# Patient Record
Sex: Male | Born: 1960
Health system: Southern US, Community
[De-identification: ages and names within clinical notes are randomized; demographics above are authoritative.]

## PROBLEM LIST (undated history)

## (undated) DIAGNOSIS — F419 Anxiety disorder, unspecified: Secondary | ICD-10-CM

## (undated) DIAGNOSIS — N400 Enlarged prostate without lower urinary tract symptoms: Secondary | ICD-10-CM

## (undated) DIAGNOSIS — E291 Testicular hypofunction: Secondary | ICD-10-CM

## (undated) DIAGNOSIS — E7800 Pure hypercholesterolemia, unspecified: Secondary | ICD-10-CM

## (undated) HISTORY — DX: Testicular hypofunction: E29.1

---

## 2007-06-02 ENCOUNTER — Other Ambulatory Visit: Payer: Self-pay

## 2007-06-02 ENCOUNTER — Ambulatory Visit: Payer: Self-pay | Admitting: Family Medicine

## 2013-11-01 LAB — BASIC METABOLIC PANEL
Anion Gap: 11 (ref 7–16)
BUN: 16 mg/dL (ref 7–18)
CALCIUM: 8.9 mg/dL (ref 8.5–10.1)
CREATININE: 1.54 mg/dL — AB (ref 0.60–1.30)
Chloride: 106 mmol/L (ref 98–107)
Co2: 24 mmol/L (ref 21–32)
EGFR (Non-African Amer.): 51 — ABNORMAL LOW
GFR CALC AF AMER: 60 — AB
Glucose: 170 mg/dL — ABNORMAL HIGH (ref 65–99)
Osmolality: 286 (ref 275–301)
POTASSIUM: 3.3 mmol/L — AB (ref 3.5–5.1)
SODIUM: 141 mmol/L (ref 136–145)

## 2013-11-01 LAB — TROPONIN I: TROPONIN-I: 0.02 ng/mL

## 2013-11-02 ENCOUNTER — Observation Stay: Payer: Self-pay | Admitting: Internal Medicine

## 2013-11-02 LAB — CBC
HCT: 42.2 % (ref 40.0–52.0)
HGB: 14 g/dL (ref 13.0–18.0)
MCH: 30 pg (ref 26.0–34.0)
MCHC: 33.2 g/dL (ref 32.0–36.0)
MCV: 90 fL (ref 80–100)
Platelet: 255 10*3/uL (ref 150–440)
RBC: 4.67 10*6/uL (ref 4.40–5.90)
RDW: 13.1 % (ref 11.5–14.5)
WBC: 20.2 10*3/uL — AB (ref 3.8–10.6)

## 2013-11-02 LAB — TROPONIN I
TROPONIN-I: 0.07 ng/mL — AB
Troponin-I: 0.09 ng/mL — ABNORMAL HIGH

## 2013-11-02 LAB — CK-MB
CK-MB: 3.9 ng/mL — AB (ref 0.5–3.6)
CK-MB: 5.1 ng/mL — ABNORMAL HIGH (ref 0.5–3.6)
CK-MB: 2.9 ng/mL (ref 0.5–3.6)

## 2013-12-24 ENCOUNTER — Ambulatory Visit: Payer: Self-pay | Admitting: Internal Medicine

## 2014-04-09 ENCOUNTER — Ambulatory Visit: Payer: Self-pay | Admitting: Urology

## 2014-09-20 NOTE — H&P (Signed)
PATIENT NAME:  Charles Turner, Charles Turner MR#:  [id number] DATE OF BIRTH:  1961/02/13  DATE OF ADMISSION:  11/02/2013  REFERRING PHYSICIAN: Lisa Roca, MD  PRIMARY CARE PHYSICIAN: Dr. Ronnald Ramp.  CHIEF COMPLAINT: Allergic reaction.  HISTORY OF PRESENT ILLNESS: A 54 year old Caucasian gentleman with a past medical history of hyperlipidemia, presenting after an allergic reaction, just prior to arrival had a bee sting on both of his legs. He self administered EpiPen after he was having symptoms of shortness of breath, palpitations, and flushing. He denies any chest pains or further symptomatology. After receiving his EpiPen they called 911. When EMS arrived, he was noted to be hypotensive with a systolic blood pressure in the 60s; however, he is normotensive by arrival to the Emergency Department. He currently denies any further symptomatology. No chest pain, palpitations, shortness of breath, chest pain; however, on ER workup noted to have ST depressions as well as T inversions laterally on EKG. No baseline to compare to.  REVIEW OF SYSTEMS:  CONSTITUTIONAL: Denies any fever, fatigue, weakness, pain. EYES: Denies blurry vision, double vision, or eye pain.  HEENT: Denies tinnitus, ear pain, or hearing loss.  RESPIRATORY: Denies current cough, wheeze, or shortness of breath. CARDIOVASCULAR: Denies any chest pain, palpitations, edema.  GASTROINTESTINAL: Denies nausea, vomiting, diarrhea, or abdominal pain.  GENITOURINARY: Denies dysuria or hematuria.  ENDOCRINE: Denies nocturia or thyroid problems.  HEMATOLOGIC AND LYMPHATIC: Denies any bruising or bleeding. SKIN: Positive for urticaria, bilateral lower extremities around bee sting site. No other rashes or lesions. MUSCULOSKELETAL: Denies pain in neck, back, shoulders, knees, hips, or arthritic symptoms.  NEUROLOGIC: Denies paralysis or paresthesias.  PSYCHIATRIC: Denies anxiety or depressive symptoms.   Otherwise, full review of systems performed by me is  negative.   PAST MEDICAL HISTORY: Hyperlipidemia.   SOCIAL HISTORY: Denies any alcohol, tobacco, or drug usage.   FAMILY HISTORY: Denies any known cardiovascular or pulmonary disorders.   ALLERGIES: NO KNOWN DRUG ALLERGIES. POSITIVE FOR BEE STINGS.   HOME MEDICATIONS: Include atorvastatin 20 mg p.o. daily.   PHYSICAL EXAMINATION:  VITAL SIGNS: Temperature 98.4, heart rate 96, respirations 20, blood pressure 139/86, saturating 99% on room air. Weight 97.5 kg, BMI 29.2.  GENERAL: Well-nourished, well-developed, Caucasian gentleman, currently in no acute distress.  HEAD: Normocephalic, atraumatic.  EYES: Pupils equal, round, reactive to light. Extraocular muscles intact. No scleral icterus.  MOUTH: Moist mucous membranes. Dentition intact. No abscess noted.  EARS, NOSE, AND THROAT: Clear without exudates. No external lesions.  NECK: Supple. No thyromegaly. No nodules. No JVD.  PULMONARY: Clear to auscultation bilaterally without wheezes, rales, or rhonchi. No use of accessory muscles. Good respiratory effort.  CHEST:  Nontender on palpation.  CARDIOVASCULAR: S1, S2, regular rate and rhythm. No murmurs, rubs, or gallops. No edema. Pedal pulses 2+ bilaterally.  GASTROINTESTINAL: Soft, nontender, nondistended. No masses. Positive bowel sounds. No hepatosplenomegaly.  MUSCULOSKELETAL: No swelling, clubbing, or edema. Range of motion is full in all extremities. NEUROLOGIC: Cranial nerves II-XII intact. No gross neurologic deficits. Sensation intact. Reflexes intact.  SKIN: Bilateral ankles: There is urticaria with surrounding erythema. No further ulcerations, lesions, no rashes, or cyanosis. Skin warm and dry. Turgor intact.  PSYCHIATRIC: Mood and affect within normal limits. The patient alert and oriented x 3. Insight and judgment intact.   LABORATORY DATA: Chest x-ray performed reveals no acute cardiopulmonary process. EKG performed revealing normal sinus rhythm; however, lateral ST depression  less than 0.5 mm without prior EKGs for comparison.  The remainder of laboratory data: Sodium 141,  potassium 3.3, chloride 106, bicarbonate 24, BUN 16, creatinine 1.54 glucose 170. Troponin 0.02.   ASSESSMENT AND PLAN:  109. A 54 year old gentleman with a history of hyperlipidemia, presenting after an allergic reaction found that EKG abnormalities. On initial workup 1 EKG abnormality. He is admitted to telemetry under observational status. Initiate aspirin and statin therapy. Trend cardiac enzymes x 3.  2. Allergic reaction greatly improved as far as symptoms were concerned. He will need an EpiPen for discharge as his were actually expired in 2013.  3. Hyperlipidemia. Continue statin therapy. 4. Venous thromboembolism prophylaxis with heparin subcutaneous.  CODE STATUS: The patient is a full code.  TIME SPENT: 45 minutes.     ____________________________ Aaron Mose. Hower, MD dkh:lt D: 11/02/2013 00:47:02 ET T: 11/02/2013 02:11:48 ET JOB#: [id number]  cc: Aaron Mose. Hower, MD, <Dictator> DAVID Woodfin Ganja MD ELECTRONICALLY SIGNED 11/02/2013 20:46

## 2014-09-20 NOTE — Discharge Summary (Signed)
PATIENT NAME:  Charles Turner, Charles Turner MR#:  [id number] DATE OF BIRTH:  09/17/1960  DATE OF ADMISSION:  11/02/2013 DATE OF DISCHARGE:  11/02/2013  DISCHARGE DIAGNOSES: 1. Anaphylaxis/allergy secondary to bee sting.  2. Hyperlipidemia.  3. Mildly elevated troponin secondary to epinephrine dose.  4. Leukocytosis secondary to the stress reaction.   IMAGING STUDIES: Include a chest x-ray which showed nothing acute.   ADMITTING HISTORY AND PHYSICAL AND HOSPITAL COURSE: Please see detailed H and P dictated previously by Dr. Lavetta Nielsen. In brief, a 54 year old male patient with history of prior anaphylactic reaction to bee stings and history of hyperlipidemia, presented to the hospital after he was stung with bees in both his legs. Had significant reaction, took an Epi-Pen shot, although that was expired and old. The patient brought into the ER, was found to be hypotensive and with some nonspecific EKG changes, was admitted to the hospitalist service for further work-up and treatment. The patient had further cardiac enzymes done which was indeterminate up to 0.09 and 0.07. The patient did not have any chest pain, shortness of breath. His troponin elevation was thought to be likely from his Epi-Pen shot and hypotension. The patient also had some leukocytosis secondary to stress. No signs of infection. Afebrile. On the day of discharge, the patient is feeling well, tele has not shown any arrhythmias, and the patient is being discharged back home with 2 packs of Epi-Pen for any further issues. The patient will follow up with his primary care physician in 1 to 2 weeks.   DISCHARGE MEDICATIONS: 1. Epi-Pen as needed for allergic reaction and anaphylaxis.  2. Atorvastatin 20 mg daily.   DISCHARGE INSTRUCTIONS:  1. Low-fat diet.  2. Activity as tolerated.  3. Follow up with primary care physician in 1 to 2 weeks.   TIME SPENT TODAY ON DISCHARGE: 45 minutes.      ____________________________ Leia Alf Nhyira Leano,  MD srs:lm D: 11/02/2013 13:01:02 ET T: 11/02/2013 23:17:25 ET JOB#: [id number]  cc: Alveta Heimlich R. Darvin Neighbours, MD, <Dictator> Juline Patch, MD Neita Carp MD ELECTRONICALLY SIGNED 11/07/2013 16:39

## 2014-12-15 ENCOUNTER — Other Ambulatory Visit: Payer: Self-pay | Admitting: Urology

## 2014-12-15 NOTE — Telephone Encounter (Signed)
Can pt have flomax refills?

## 2015-01-04 ENCOUNTER — Emergency Department
Admission: EM | Admit: 2015-01-04 | Discharge: 2015-01-04 | Disposition: A | Discharge status: Home / Self Care | Payer: BLUE CROSS/BLUE SHIELD | Attending: Student | Admitting: Student

## 2015-01-04 ENCOUNTER — Other Ambulatory Visit: Payer: Self-pay

## 2015-01-04 ENCOUNTER — Emergency Department: Payer: BLUE CROSS/BLUE SHIELD

## 2015-01-04 ENCOUNTER — Encounter: Payer: Self-pay | Admitting: Emergency Medicine

## 2015-01-04 DIAGNOSIS — R451 Restlessness and agitation: Secondary | ICD-10-CM | POA: Diagnosis not present

## 2015-01-04 DIAGNOSIS — Y9289 Other specified places as the place of occurrence of the external cause: Secondary | ICD-10-CM | POA: Diagnosis not present

## 2015-01-04 DIAGNOSIS — Y9389 Activity, other specified: Secondary | ICD-10-CM | POA: Diagnosis not present

## 2015-01-04 DIAGNOSIS — E876 Hypokalemia: Secondary | ICD-10-CM

## 2015-01-04 DIAGNOSIS — Y998 Other external cause status: Secondary | ICD-10-CM | POA: Diagnosis not present

## 2015-01-04 DIAGNOSIS — T63461A Toxic effect of venom of wasps, accidental (unintentional), initial encounter: Secondary | ICD-10-CM | POA: Diagnosis not present

## 2015-01-04 LAB — COMPREHENSIVE METABOLIC PANEL
ALBUMIN: 4 g/dL (ref 3.5–5.0)
ALT: 18 U/L (ref 17–63)
AST: 26 U/L (ref 15–41)
Alkaline Phosphatase: 41 U/L (ref 38–126)
Anion gap: 13 (ref 5–15)
BUN: 16 mg/dL (ref 6–20)
CO2: 25 mmol/L (ref 22–32)
CREATININE: 1.34 mg/dL — AB (ref 0.61–1.24)
Calcium: 8.5 mg/dL — ABNORMAL LOW (ref 8.9–10.3)
Chloride: 101 mmol/L (ref 101–111)
GFR calc Af Amer: >60 mL/min (ref >60)
GFR, EST NON AFRICAN AMERICAN: 59 mL/min — AB (ref >60)
GLUCOSE: 195 mg/dL — AB (ref 65–99)
Potassium: 2.8 mmol/L — CL (ref 3.5–5.1)
SODIUM: 139 mmol/L (ref 135–145)
Total Bilirubin: 0.6 mg/dL (ref 0.3–1.2)
Total Protein: 6.3 g/dL — ABNORMAL LOW (ref 6.5–8.1)

## 2015-01-04 LAB — CBC WITH DIFFERENTIAL/PLATELET
BASOS ABS: 0.1 10*3/uL (ref 0–0.1)
Basophils Relative: 2 %
Eosinophils Absolute: 0.1 10*3/uL (ref 0–0.7)
Eosinophils Relative: 1 %
HCT: 41 % (ref 40.0–52.0)
Hemoglobin: 13.8 g/dL (ref 13.0–18.0)
Lymphocytes Relative: 45 %
Lymphs Abs: 4 10*3/uL — ABNORMAL HIGH (ref 1.0–3.6)
MCH: 29.9 pg (ref 26.0–34.0)
MCHC: 33.7 g/dL (ref 32.0–36.0)
MCV: 88.6 fL (ref 80.0–100.0)
MONOS PCT: 4 %
Monocytes Absolute: 0.4 10*3/uL (ref 0.2–1.0)
Neutro Abs: 4.3 10*3/uL (ref 1.4–6.5)
Neutrophils Relative %: 48 %
Platelets: 238 10*3/uL (ref 150–440)
RBC: 4.63 MIL/uL (ref 4.40–5.90)
RDW: 13.7 % (ref 11.5–14.5)
WBC: 8.9 10*3/uL (ref 3.8–10.6)

## 2015-01-04 LAB — TROPONIN I: Troponin I: <0.03 ng/mL (ref <0.031)

## 2015-01-04 MED ORDER — EPINEPHRINE 0.3 MG/0.3ML IJ SOAJ: 0.3000 mg | Freq: Once | INTRAMUSCULAR | Status: DC

## 2015-01-04 MED ORDER — PREDNISONE 20 MG PO TABS: 60.0000 mg | ORAL_TABLET | Freq: Every day | ORAL | Status: DC

## 2015-01-04 MED ORDER — POTASSIUM CHLORIDE CRYS ER 20 MEQ PO TBCR
40.0000 meq | EXTENDED_RELEASE_TABLET | Freq: Once | ORAL | Status: AC
  Administered 2015-01-04: 40 meq via ORAL
  Filled 2015-01-04: qty 2

## 2015-01-04 NOTE — ED Provider Notes (Signed)
[facility] Emergency Department Provider Note  ____________________________________________  Time seen: Approximately 4:11 PM  I have reviewed the triage vital signs and the nursing notes.   HISTORY  Chief Complaint Allergic Reaction  Caveat - History of present illness and review of systems Limited secondary to the patient's agitation. All information obtained from son at bedside as well as EMS on their arrival.  HPI Charles Turner is a 54 y.o. male with no chronic medical problems presents for evaluation of severe allergic reaction after being stung by a wasp. Son reports the patient has known severe allergy to wasp venom. Earlier today he was walking outside and got stung by wasp in the back of the head. When he came inside he started to have some difficulty breathing and his son administered his EpiPen and gave him 3 Benadryl tablets.On EMS arrival he was hypotensive and received 0.15 mg of subcutaneous epi, 125 mg of Solu-Medrol, 50 mg of Benadryl and 150 mg of Zantac IV. He also received several DuoNeb. Son reports that prior to that he had been in his usual state of health.   History reviewed. No pertinent past medical history.  There are no active problems to display for this patient.   History reviewed. No pertinent past surgical history.  Current Outpatient Rx  Name  Route  Sig  Dispense  Refill  . tamsulosin (FLOMAX) 0.4 MG CAPS capsule      TAKE ONE CAPSULE BY MOUTH TWICE A DAY   180 capsule   1     Allergies Wasp venom  History reviewed. No pertinent family history.  Social History History  Substance Use Topics  . Smoking status: Never Smoker   . Smokeless tobacco: Not on file  . Alcohol Use: No    Review of Systems   History of present illness and review of systems Limited secondary to the patient's agitation. All information obtained from son at bedside as well as EMS on their  arrival. ____________________________________________   PHYSICAL EXAM:  VITAL SIGNS: ED Triage Vitals  Enc Vitals Group     BP 01/04/15 1604 152/70 mmHg     Pulse Rate 01/04/15 1604 92     Resp 01/04/15 1604 15     Temp 01/04/15 1604 97.8 F (36.6 C)     Temp src --      SpO2 01/04/15 1604 96 %     Weight 01/04/15 1604 200 lb (90.719 kg)     Height 01/04/15 1604 6\' 1"  (1.854 m)     Head Cir --      Peak Flow --      Pain Score 01/04/15 1604 0     Pain Loc --      Pain Edu? --      Excl. in [place]? --     Constitutional: Awake, agitated and tearful but easily redirected, follows all commands. Eyes: Conjunctivae are normal. PERRL. EOMI. Head: Small area of swelling to the left parietal scalp from wasp sting. Nose: No congestion/rhinnorhea. Mouth/Throat: Mucous membranes are moist.  Oropharynx non-erythematous, nonedematous. Neck: No stridor.   Cardiovascular: Normal rate, regular rhythm. Grossly normal heart sounds.  Good peripheral circulation. Respiratory: Normal respiratory effort.  No retractions. Lungs CTAB. No wheeze. Gastrointestinal: Soft and nontender. No distention. No abdominal bruits. No CVA tenderness. Genitourinary: deferred Musculoskeletal: No lower extremity tenderness nor edema.  No joint effusions. Neurologic:  Normal speech and language. No gross focal neurologic deficits are appreciated.  Skin:  Skin is warm,  dry and intact. No rash noted. Psychiatric: Mood and affect are normal. Speech and behavior are normal.  ____________________________________________   LABS (all labs ordered are listed, but only abnormal results are displayed)  Labs Reviewed  CBC WITH DIFFERENTIAL/PLATELET - Abnormal; Notable for the following:    Lymphs Abs 4.0 (*)    All other components within normal limits  COMPREHENSIVE METABOLIC PANEL  TROPONIN I   ____________________________________________  EKG  ED ECG REPORT I, Joanne Gavel, the attending physician, personally  viewed and interpreted this ECG.   Date: 01/04/2015  EKG Time: 16:05  Rate: 89  Rhythm: normal sinus rhythm  Axis: normal  Intervals:none  ST&T Change: No acute ST segment elevation. ST depression and T-wave inversion in V3, V4, V5, V6. QTc mildly prolonged at 501 ms. EKG unchanged from 11/01/2013. ____________________________________________  RADIOLOGY  CXR FINDINGS: Monitoring leads overlie the patient. Stable cardiac and mediastinal contours. Elevation right hemidiaphragm. No consolidative pulmonary opacities. No pleural effusion or pneumothorax.  IMPRESSION: No acute cardiopulmonary process.  ____________________________________________   PROCEDURES  Procedure(s) performed: None  Critical Care performed: No  ____________________________________________   INITIAL IMPRESSION / ASSESSMENT AND PLAN / ED COURSE  Pertinent labs & imaging results that were available during my care of the patient were reviewed by me and considered in my medical decision making (see chart for details).  Charles Turner is a 54 y.o. male with no chronic medical problems presents for evaluation of severe allergic reaction after being stung by a wasp. He received treatment from EMS and his son prior to arrival. On arrival to the emergency department he is agitated but redirectable. Vital signs are stable, he is afebrile. No swelling of the posterior oropharynx, handling secretions, no concern for impending airway compromise. Plan for basic labs, chest x-ray, observation. Reassess for disposition.  ----------------------------------------- 6:18 PM on 01/04/2015 -----------------------------------------  Patient is awake, alert, oriented, sitting up in bed drinking water.  ----------------------------------------- 8:06 PM on 01/04/2015 -----------------------------------------  Patient reports he feels much better at this time. He is alert and oriented 4, in no acute distress. Tachycardia  resolved. Labs are notable for hypokalemia. He received 40 mEq of potassium for repletion and I discussed with him that he needs to follow up with his primary care doctor for recheck of his potassium within the next 1-2 days.  He and wife voice understanding of this. Creatinine mildly elevated at 1.34, was 1.54 in the past. Troponin negative. EKG was unchanged from prior. Discussed return precautions and patient and wife at bedside are comfortable with the discharge plan. ____________________________________________   FINAL CLINICAL IMPRESSION(S) / ED DIAGNOSES  Final diagnoses:  Wasp sting-induced anaphylaxis, accidental or unintentional, initial encounter      Joanne Gavel, MD 01/04/15 2009

## 2015-01-04 NOTE — ED Notes (Signed)
Pt to ER via EMS from home with c/o allergic reaction to wasp sting.  Pt has hx of same.  PER EMS epi 0.45mg  subq, Solumedrol 125mg  iv, 50 benadryl IV, 150 Zantac IV.  Per family pt also took 3 benadryl PO prior  To EMS arrival.  PT also had multiple breathing treatments

## 2015-01-07 ENCOUNTER — Ambulatory Visit: Payer: Self-pay | Admitting: Urology

## 2015-03-23 ENCOUNTER — Other Ambulatory Visit: Payer: Self-pay | Admitting: Internal Medicine

## 2015-05-05 ENCOUNTER — Ambulatory Visit (INDEPENDENT_AMBULATORY_CARE_PROVIDER_SITE_OTHER): Payer: BLUE CROSS/BLUE SHIELD | Admitting: Internal Medicine

## 2015-05-05 ENCOUNTER — Ambulatory Visit: Payer: Self-pay | Admitting: Family Medicine

## 2015-05-05 ENCOUNTER — Encounter: Payer: Self-pay | Admitting: Internal Medicine

## 2015-05-05 VITALS — BP 140/80 | HR 80 | Ht 72.0 in | Wt 216.4 lb

## 2015-05-05 DIAGNOSIS — F410 Panic disorder [episodic paroxysmal anxiety] without agoraphobia: Secondary | ICD-10-CM

## 2015-05-05 DIAGNOSIS — Z91038 Other insect allergy status: Secondary | ICD-10-CM | POA: Diagnosis not present

## 2015-05-05 DIAGNOSIS — M549 Dorsalgia, unspecified: Secondary | ICD-10-CM | POA: Insufficient documentation

## 2015-05-05 DIAGNOSIS — Z91030 Bee allergy status: Secondary | ICD-10-CM

## 2015-05-05 DIAGNOSIS — R7989 Other specified abnormal findings of blood chemistry: Secondary | ICD-10-CM | POA: Insufficient documentation

## 2015-05-05 DIAGNOSIS — R319 Hematuria, unspecified: Secondary | ICD-10-CM | POA: Insufficient documentation

## 2015-05-05 DIAGNOSIS — R945 Abnormal results of liver function studies: Secondary | ICD-10-CM | POA: Insufficient documentation

## 2015-05-05 DIAGNOSIS — E782 Mixed hyperlipidemia: Secondary | ICD-10-CM | POA: Insufficient documentation

## 2015-05-05 DIAGNOSIS — E291 Testicular hypofunction: Secondary | ICD-10-CM | POA: Insufficient documentation

## 2015-05-05 DIAGNOSIS — N400 Enlarged prostate without lower urinary tract symptoms: Secondary | ICD-10-CM | POA: Insufficient documentation

## 2015-05-05 DIAGNOSIS — D494 Neoplasm of unspecified behavior of bladder: Secondary | ICD-10-CM | POA: Insufficient documentation

## 2015-05-05 DIAGNOSIS — N529 Male erectile dysfunction, unspecified: Secondary | ICD-10-CM | POA: Insufficient documentation

## 2015-05-05 MED ORDER — PAROXETINE HCL ER 25 MG PO TB24
25.0000 mg | ORAL_TABLET | Freq: Every day | ORAL | Status: DC
Start: 2015-05-05 — End: 2015-06-17

## 2015-05-05 NOTE — Progress Notes (Signed)
Date:  05/05/2015   Name:  Charles Turner   DOB:  1960-11-28   MRN:  [id number]   Chief Complaint: Anxiety Bee Sting Allergy - Severe - had a very severe reaction to a bee sting in August.  Subsequently started sensitization therapy but had another severe reaction on week 7 requiring a trip to the ER.  He is very reluctant to proceed with therapy.  The specialist says that if he gets stung again he will die unless he is in a medical facility. Anxiety Presents for initial visit. Onset was in the past 7 days. Symptoms include nervous/anxious behavior. Patient reports no chest pain, palpitations or shortness of breath. Episode frequency: two severe episodes in the past 5 days.   Risk factors include prior traumatic experience. Past treatments include benzodiazephines (lorazepam 1 mg tid prn).   he was seen 2 days ago in the emergency room at [place]. They did rule out cardiac cause with a normal EKG and normal blood work. They prescribed lorazepam which she has been taking twice a day.  Review of Systems  Constitutional: Positive for fatigue. Negative for fever, chills and diaphoresis.  HENT: Negative for facial swelling, hearing loss, sore throat, tinnitus, trouble swallowing and voice change.   Eyes: Negative for visual disturbance.  Respiratory: Negative for cough, chest tightness, shortness of breath and wheezing.   Cardiovascular: Negative for chest pain, palpitations and leg swelling.  Gastrointestinal: Negative for abdominal pain and diarrhea.  Musculoskeletal: Positive for arthralgias (knee pain).  Neurological: Positive for light-headedness. Negative for numbness.  Psychiatric/Behavioral: Positive for dysphoric mood. The patient is nervous/anxious.     Patient Active Problem List   Diagnosis Date Noted  . Familial multiple lipoprotein-type hyperlipidemia 05/05/2015  . Benign enlargement of prostate 05/05/2015  . Back ache 05/05/2015  . Bladder neoplasm 05/05/2015  . Abnormal  LFTs 05/05/2015  . Failure of erection 05/05/2015  . Blood in the urine 05/05/2015  . 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 Authorizing Provider  EPINEPHrine 0.3 mg/0.3 mL IJ SOAJ injection Inject 0.3 mLs (0.3 mg total) into the muscle once. 01/04/15  Yes Joanne Gavel, MD  LORazepam (ATIVAN) 1 MG tablet  05/03/15  Yes Historical Provider, MD  tamsulosin (FLOMAX) 0.4 MG CAPS capsule Take 0.4 mg by mouth 2 (two) times daily. 03/20/15  Yes Historical Provider, MD  VIAGRA 100 MG tablet TAKE 1 TABLET BY MOUTH EVERY DAY AS NEEDED 03/23/15  Yes Glean Hess, MD    Allergies  Allergen Reactions  . Bee Venom Anaphylaxis  . Wasp Venom Anaphylaxis    No past surgical history on file.  Social History  Substance Use Topics  . Smoking status: Never Smoker   . Smokeless tobacco: None  . Alcohol Use: No     Medication list has been reviewed and updated.   Physical Exam  Constitutional: He is oriented to person, place, and time. He appears well-developed and well-nourished.  Neck: Normal range of motion. Neck supple. No thyromegaly present.  Cardiovascular: Normal rate, regular rhythm and normal heart sounds.   Pulmonary/Chest: Effort normal and breath sounds normal. He has no wheezes. He exhibits no tenderness.  Musculoskeletal: Normal range of motion. He exhibits no edema or tenderness.  Lymphadenopathy:    He has no cervical adenopathy.  Neurological: He is alert and oriented to person, place, and time.  Skin: Skin is warm and dry.  Psychiatric: He has a normal  mood and affect. His behavior is normal. Thought content normal.  Nursing note and vitals reviewed.   BP 140/80 mmHg  Pulse 80  Ht 6' (1.829 m)  Wt 216 lb 6.4 oz (98.158 kg)  BMI 29.34 kg/m2  Assessment and Plan: 1. Bee sting allergy Patient has had 2 traumatic experiences with his immunotherapy However he does need to consider ongoing therapy to avoid  life-threatening reactions  2. Panic disorder Will begin Paxil CR 25 mg once daily at bedtime He may use lorazepam 1/2-1 mg as needed for recurrent panic attacks He should also consider counselling   Halina Maidens, MD [facility]  05/05/2015

## 2015-06-14 ENCOUNTER — Other Ambulatory Visit: Payer: Self-pay | Admitting: Urology

## 2015-06-17 ENCOUNTER — Ambulatory Visit (INDEPENDENT_AMBULATORY_CARE_PROVIDER_SITE_OTHER): Payer: BLUE CROSS/BLUE SHIELD | Admitting: Internal Medicine

## 2015-06-17 ENCOUNTER — Encounter: Payer: Self-pay | Admitting: Internal Medicine

## 2015-06-17 VITALS — BP 122/80 | HR 64 | Ht 72.0 in | Wt 216.0 lb

## 2015-06-17 DIAGNOSIS — Z91038 Other insect allergy status: Secondary | ICD-10-CM | POA: Diagnosis not present

## 2015-06-17 DIAGNOSIS — F419 Anxiety disorder, unspecified: Secondary | ICD-10-CM | POA: Insufficient documentation

## 2015-06-17 DIAGNOSIS — Z91030 Bee allergy status: Secondary | ICD-10-CM | POA: Insufficient documentation

## 2015-06-17 DIAGNOSIS — F411 Generalized anxiety disorder: Secondary | ICD-10-CM | POA: Insufficient documentation

## 2015-06-17 MED ORDER — PAROXETINE HCL ER 25 MG PO TB24: 25.0000 mg | ORAL_TABLET | Freq: Every day | ORAL | Status: DC

## 2015-06-17 NOTE — Progress Notes (Signed)
Date:  06/17/2015   Name:  Charles Turner   DOB:  08-19-1960   MRN:  [id number]   Chief Complaint: Follow-up Anxiety Presents for follow-up visit. Patient reports no nervous/anxious behavior (improved), palpitations, panic or shortness of breath. Primary symptoms comment: symptoms of anxiety, panic and palpitations much improved. Symptoms occur rarely. The severity of symptoms is mild. The quality of sleep is good.   Compliance with medications is 76-100%.  Started Paxil last visit.  Has had much less panic symptoms and generally calmer. He denies significant side effects - no headache or nausea.  No change in weight or appetite or sleep. His legs sometimes feel weak but no loss of strength and mild bloating and gas.  Review of Systems  Constitutional: Negative for chills and fatigue.  Respiratory: Negative for shortness of breath.   Cardiovascular: Negative for palpitations.  Psychiatric/Behavioral: Negative for behavioral problems and dysphoric mood. The patient is not nervous/anxious (improved).     Patient Active Problem List   Diagnosis Date Noted  . Chronic anxiety 06/17/2015  . Bee sting allergy 06/17/2015  . Familial multiple lipoprotein-type hyperlipidemia 05/05/2015  . Benign enlargement of prostate 05/05/2015  . Back ache 05/05/2015  . Bladder neoplasm 05/05/2015  . Abnormal LFTs 05/05/2015  . Failure of erection 05/05/2015  . Blood in the urine 05/05/2015  . 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 Authorizing Provider  EPINEPHrine 0.3 mg/0.3 mL IJ SOAJ injection Inject 0.3 mLs (0.3 mg total) into the muscle once. 01/04/15  Yes Joanne Gavel, MD  PARoxetine (PAXIL-CR) 25 MG 24 hr tablet Take 1 tablet (25 mg total) by mouth daily. 05/05/15  Yes Glean Hess, MD  tamsulosin (FLOMAX) 0.4 MG CAPS capsule Take 0.4 mg by mouth 2 (two) times daily. 03/20/15  Yes Historical Provider, MD  VIAGRA 100 MG  tablet TAKE 1 TABLET BY MOUTH EVERY DAY AS NEEDED 03/23/15  Yes Glean Hess, MD  LORazepam (ATIVAN) 1 MG tablet Reported on 06/17/2015 05/03/15   Historical Provider, MD    Allergies  Allergen Reactions  . Bee Venom Anaphylaxis  . Wasp Venom Anaphylaxis    History reviewed. No pertinent past surgical history.  Social History  Substance Use Topics  . Smoking status: Never Smoker   . Smokeless tobacco: None  . Alcohol Use: No     Medication list has been reviewed and updated.   Physical Exam  Constitutional: He is oriented to person, place, and time. He appears well-developed. No distress.  HENT:  Head: Normocephalic and atraumatic.  Cardiovascular: Normal rate, regular rhythm and normal heart sounds.   Pulmonary/Chest: Effort normal and breath sounds normal. No respiratory distress.  Musculoskeletal: Normal range of motion.  Neurological: He is alert and oriented to person, place, and time.  Skin: Skin is warm and dry. No rash noted.  Psychiatric: He has a normal mood and affect. His behavior is normal. Thought content normal.    BP 122/80 mmHg  Pulse 64  Ht 6' (1.829 m)  Wt 216 lb (97.977 kg)  BMI 29.29 kg/m2  Assessment and Plan: 1. Chronic anxiety Improved - continue Paxil Monitor for progression of unusual symptoms - PARoxetine (PAXIL-CR) 25 MG 24 hr tablet; Take 1 tablet (25 mg total) by mouth daily.  Dispense: 30 tablet; Refill: 5  2. Bee sting allergy Keeping Epi-pen at all times May be adjusting work duties to avoid risky situations  Halina Maidens, MD [facility]  06/17/2015

## 2015-06-19 ENCOUNTER — Other Ambulatory Visit: Payer: Self-pay

## 2015-06-19 DIAGNOSIS — N400 Enlarged prostate without lower urinary tract symptoms: Secondary | ICD-10-CM

## 2015-06-19 MED ORDER — TAMSULOSIN HCL 0.4 MG PO CAPS: 0.4000 mg | ORAL_CAPSULE | Freq: Two times a day (BID) | ORAL | Status: DC

## 2015-11-05 ENCOUNTER — Telehealth: Payer: Self-pay

## 2015-11-05 DIAGNOSIS — N400 Enlarged prostate without lower urinary tract symptoms: Secondary | ICD-10-CM

## 2015-11-05 MED ORDER — TAMSULOSIN HCL 0.4 MG PO CAPS: 0.4000 mg | ORAL_CAPSULE | Freq: Two times a day (BID) | ORAL | Status: DC

## 2015-11-05 NOTE — Telephone Encounter (Signed)
Pt pharmacy sent a refill request for tamsulosin. 30 days with no refills was given. LMOM for pt as he needs an appt.

## 2015-11-10 ENCOUNTER — Other Ambulatory Visit: Payer: Self-pay | Admitting: Internal Medicine

## 2015-11-11 NOTE — Telephone Encounter (Signed)
pts coming in on 10/17 for cpe

## 2015-11-19 ENCOUNTER — Other Ambulatory Visit: Payer: Self-pay | Admitting: Internal Medicine

## 2015-11-23 ENCOUNTER — Telehealth: Payer: Self-pay

## 2015-11-23 NOTE — Telephone Encounter (Signed)
Urology wanted to follow up and so patient called [place] for refill Flomax. Was disappointed when we denied it and asked that he follow up with them. He wants to discuss at CPE that he does not want to go back to urology.

## 2015-11-30 ENCOUNTER — Other Ambulatory Visit: Payer: Self-pay | Admitting: Urology

## 2015-11-30 NOTE — Telephone Encounter (Signed)
Tamsulosin has been denied as pt has been made aware for the need of a f/u appt. No f/u appt on file.

## 2015-12-14 ENCOUNTER — Other Ambulatory Visit: Payer: Self-pay | Admitting: Internal Medicine

## 2015-12-16 ENCOUNTER — Other Ambulatory Visit: Payer: Self-pay | Admitting: Internal Medicine

## 2015-12-22 ENCOUNTER — Ambulatory Visit (INDEPENDENT_AMBULATORY_CARE_PROVIDER_SITE_OTHER): Payer: BLUE CROSS/BLUE SHIELD | Admitting: Urology

## 2015-12-22 VITALS — Ht 72.0 in | Wt 220.9 lb

## 2015-12-22 DIAGNOSIS — N401 Enlarged prostate with lower urinary tract symptoms: Secondary | ICD-10-CM

## 2015-12-22 DIAGNOSIS — N400 Enlarged prostate without lower urinary tract symptoms: Secondary | ICD-10-CM

## 2015-12-22 DIAGNOSIS — N2889 Other specified disorders of kidney and ureter: Secondary | ICD-10-CM | POA: Diagnosis not present

## 2015-12-22 DIAGNOSIS — R351 Nocturia: Secondary | ICD-10-CM

## 2015-12-22 DIAGNOSIS — R338 Other retention of urine: Secondary | ICD-10-CM | POA: Diagnosis not present

## 2015-12-22 LAB — URINALYSIS, COMPLETE
Bilirubin, UA: NEGATIVE
Glucose, UA: NEGATIVE
Ketones, UA: NEGATIVE
NITRITE UA: NEGATIVE
Specific Gravity, UA: 1.02 (ref 1.005–1.030)
Urobilinogen, Ur: 0.2 mg/dL (ref 0.2–1.0)
pH, UA: 5.5 (ref 5.0–7.5)

## 2015-12-22 LAB — MICROSCOPIC EXAMINATION
BACTERIA UA: NONE SEEN
Epithelial Cells (non renal): NONE SEEN /hpf (ref 0–10)
WBC, UA: >30 /hpf — AB (ref 0–?)

## 2015-12-22 LAB — BLADDER SCAN AMB NON-IMAGING: Scan Result: 838

## 2015-12-22 MED ORDER — SILODOSIN 8 MG PO CAPS: 8.0000 mg | ORAL_CAPSULE | Freq: Every day | ORAL | 6 refills | Status: DC

## 2015-12-22 MED ORDER — FINASTERIDE 5 MG PO TABS: 5.0000 mg | ORAL_TABLET | Freq: Every day | ORAL | 3 refills | Status: AC

## 2015-12-22 NOTE — Progress Notes (Signed)
12/22/2015 9:19 AM   Charles Turner 28-Dec-1960 [id number]  Referring provider: Glean Hess, MD [address] [place], [place] [address]  Chief Complaint  Patient presents with  . Follow-up    BPH    HPI: Charles Turner is a 55yo seen today for BPH with LUTS. He was previously seen by Dr. Elnoria Howard for BPH and microhematuria. He has undergone hematuria workup x2 which was negative for malignancy per Dr. Guinevere Ferrari notes. He has a large bladder diverticulum. He was supposed to undergo UDS by Dr. Matilde Sprang but this was never done due to patient not wanting to have the test. He is currently on BID flomax with poor results. He has severe urgency, frequency, weak stream and nocturia 4-5x. He ran out of his flomax 3 weeks ago.   PVR >800cc   Serum creatinine was 1.34 in 12/2014.   PMH: No past medical history on file.  Surgical History: No past surgical history on file.  Home Medications:    Medication List       Accurate as of 12/22/15  9:19 AM. Always use your most recent med list.          PARoxetine 25 MG 24 hr tablet Commonly known as:  PAXIL-CR TAKE 1 TABLET (25 MG TOTAL) BY MOUTH DAILY.   tamsulosin 0.4 MG Caps capsule Commonly known as:  FLOMAX Take 1 capsule (0.4 mg total) by mouth 2 (two) times daily.   VIAGRA 100 MG tablet Generic drug:  sildenafil TAKE 1 TABLET BY MOUTH EVERY DAY AS NEEDED       Allergies:  Allergies  Allergen Reactions  . Bee Venom Anaphylaxis  . Wasp Venom Anaphylaxis    Family History: Family History  Problem Relation Age of Onset  . Adopted: Yes  . Family history unknown: Yes    Social History:  reports that he has never smoked. He does not have any smokeless tobacco history on file. He reports that he does not drink alcohol. His drug history is not on file.  ROS: UROLOGY Frequent Urination?: No Hard to postpone urination?: No Burning/pain with urination?: No Get up at night to urinate?: No Leakage of urine?:  No Urine stream starts and stops?: Yes Trouble starting stream?: Yes Do you have to strain to urinate?: Yes Blood in urine?: No Urinary tract infection?: No Sexually transmitted disease?: No Injury to kidneys or bladder?: No Painful intercourse?: No Weak stream?: Yes Erection problems?: No Penile pain?: No  Gastrointestinal Nausea?: No Vomiting?: No Diarrhea?: No Constipation?: No  Constitutional Fever: No Night sweats?: No Weight loss?: No  Skin Skin rash/lesions?: No Itching?: No  Eyes Blurred vision?: No Double vision?: No  Ears/Nose/Throat Sore throat?: No Sinus problems?: No  Hematologic/Lymphatic Swollen glands?: No Easy bruising?: No  Cardiovascular Leg swelling?: No Chest pain?: No  Respiratory Cough?: No Shortness of breath?: No  Endocrine Excessive thirst?: No  Musculoskeletal Back pain?: No Joint pain?: No  Neurological Headaches?: No Dizziness?: No  Psychologic Depression?: No Anxiety?: No  Physical Exam: Ht 6' (1.829 m)   Wt 100.2 kg (220 lb 14.4 oz)   BMI 29.96 kg/m   Constitutional:  Alert and oriented, No acute distress. HEENT: [place] AT, moist mucus membranes.  Trachea midline, no masses. Cardiovascular: No clubbing, cyanosis, or edema. Respiratory: Normal respiratory effort, no increased work of breathing. GI: Abdomen is soft, nontender, nondistended, no abdominal masses GU: No CVA tenderness. Circumcised phallus. No masses/lesions on penis/testis/scrotum. Prostate 60g smooth, no nodules, no induration.  Normal rectal tone Skin: No rashes, bruises or suspicious lesions. Lymph: No cervical or inguinal adenopathy. Neurologic: Grossly intact, no focal deficits, moving all 4 extremities. Psychiatric: Normal mood and affect.  Laboratory Data: Lab Results  Component Value Date   WBC 8.9 01/04/2015   HGB 13.8 01/04/2015   HCT 41.0 01/04/2015   MCV 88.6 01/04/2015   PLT 238 01/04/2015    Lab Results  Component Value Date    CREATININE 1.34 (H) 01/04/2015    No results found for: PSA  No results found for: TESTOSTERONE  No results found for: HGBA1C  Urinalysis No results found for: COLORURINE, APPEARANCEUR, LABSPEC, PHURINE, GLUCOSEU, HGBUR, BILIRUBINUR, KETONESUR, PROTEINUR, UROBILINOGEN, NITRITE, LEUKOCYTESUR  Pertinent Imaging: none  Assessment & Plan:    1. Benign enlargement of prostate -rapaflo 8mg  daily -finasteride 5mg  daily -RTC 4 weeks RTC 2 weeks for PVR -Pt deferred foley catheter placement today. Pt instructed to double void. - Bladder Scan (Post Void Residual) in office   No Follow-up on file.  Nicolette Bang, MD  [facility] [address] [place], [place] [address] [phone]

## 2015-12-23 ENCOUNTER — Ambulatory Visit: Payer: BLUE CROSS/BLUE SHIELD | Admitting: Internal Medicine

## 2016-01-05 ENCOUNTER — Ambulatory Visit (INDEPENDENT_AMBULATORY_CARE_PROVIDER_SITE_OTHER): Payer: BLUE CROSS/BLUE SHIELD | Admitting: Urology

## 2016-01-05 ENCOUNTER — Encounter: Payer: Self-pay | Admitting: Urology

## 2016-01-05 VITALS — BP 145/85 | HR 66 | Ht 72.0 in | Wt 221.6 lb

## 2016-01-05 DIAGNOSIS — N400 Enlarged prostate without lower urinary tract symptoms: Secondary | ICD-10-CM

## 2016-01-05 LAB — BLADDER SCAN AMB NON-IMAGING: Scan Result: 935

## 2016-01-05 NOTE — Progress Notes (Signed)
01/05/2016 11:06 AM   [name] Sep 27, 1960 [id number]  Referring provider: Glean Hess, MD [address] [place], [place] [address]  Chief Complaint  Patient presents with  . Follow-up    HPI: Mr Deveau is a 55yo seen today for BPH with LUTS. He was previously seen by Dr. Elnoria Howard for BPH and microhematuria. He has undergone hematuria workup x2 which was negative for malignancy per Dr. Guinevere Ferrari notes. He has a large bladder diverticulum. He was supposed to undergo UDS by Dr. Matilde Sprang but this was never done due to patient not wanting to have the test. He is currently on BID flomax with poor results. He has severe urgency, frequency, weak stream and nocturia 4-5x. He ran out of his flomax 3 weeks ago.   PVR >800cc    Interval hx: Mr Oldman returns today and continues to have issues with urgency, frequency and incomplete emptying. He is taking rapaflo and finasteride. He notes his LUTS have improved slightly with better emptying but his PVR is >900cc today. He has nocturia 3-4x.    PMH: History reviewed. No pertinent past medical history.  Surgical History: History reviewed. No pertinent surgical history.  Home Medications:    Medication List       Accurate as of 01/05/16 11:06 AM. Always use your most recent med list.          finasteride 5 MG tablet Commonly known as:  PROSCAR Take 1 tablet (5 mg total) by mouth daily.   PARoxetine 25 MG 24 hr tablet Commonly known as:  PAXIL-CR TAKE 1 TABLET (25 MG TOTAL) BY MOUTH DAILY.   silodosin 8 MG Caps capsule Commonly known as:  RAPAFLO Take 1 capsule (8 mg total) by mouth daily with breakfast.   tamsulosin 0.4 MG Caps capsule Commonly known as:  FLOMAX Take 1 capsule (0.4 mg total) by mouth 2 (two) times daily.   VIAGRA 100 MG tablet Generic drug:  sildenafil TAKE 1 TABLET BY MOUTH EVERY DAY AS NEEDED       Allergies:  Allergies  Allergen Reactions  . Bee Venom Anaphylaxis  . Wasp Venom  Anaphylaxis    Family History: Family History  Problem Relation Age of Onset  . Adopted: Yes  . Family history unknown: Yes    Social History:  reports that he has never smoked. He has quit using smokeless tobacco. His smokeless tobacco use included Chew. He reports that he does not drink alcohol. His drug history is not on file.  ROS: UROLOGY Frequent Urination?: No Hard to postpone urination?: No Burning/pain with urination?: No Get up at night to urinate?: No Leakage of urine?: No Urine stream starts and stops?: Yes Trouble starting stream?: Yes Do you have to strain to urinate?: Yes Blood in urine?: No Urinary tract infection?: No Sexually transmitted disease?: No Injury to kidneys or bladder?: No Painful intercourse?: No Weak stream?: Yes Erection problems?: No Penile pain?: No  Gastrointestinal Nausea?: No Vomiting?: No Indigestion/heartburn?: No Diarrhea?: No Constipation?: No  Constitutional Fever: No Night sweats?: No Weight loss?: No Fatigue?: No  Skin Skin rash/lesions?: No Itching?: Yes  Eyes Blurred vision?: No Double vision?: No  Ears/Nose/Throat Sore throat?: No Sinus problems?: No  Hematologic/Lymphatic Swollen glands?: No Easy bruising?: No  Cardiovascular Leg swelling?: No Chest pain?: No  Respiratory Cough?: No Shortness of breath?: No  Endocrine Excessive thirst?: No  Musculoskeletal Back pain?: Yes Joint pain?: No  Neurological Headaches?: No Dizziness?: No  Psychologic Depression?: No Anxiety?:  No  Physical Exam: BP (!) 145/85 (BP Location: Left Arm, Patient Position: Sitting, Cuff Size: Large)   Pulse 66   Ht 6' (1.829 m)   Wt 100.5 kg (221 lb 9.6 oz)   BMI 30.05 kg/m   Constitutional:  Alert and oriented, No acute distress. HEENT: [place] AT, moist mucus membranes.  Trachea midline, no masses. Cardiovascular: No clubbing, cyanosis, or edema. Respiratory: Normal respiratory effort, no increased work of  breathing. GI: Abdomen is soft, nontender, nondistended, no abdominal masses GU: No CVA tenderness.  Skin: No rashes, bruises or suspicious lesions. Lymph: No cervical or inguinal adenopathy. Neurologic: Grossly intact, no focal deficits, moving all 4 extremities. Psychiatric: Normal mood and affect.  Laboratory Data: Lab Results  Component Value Date   WBC 8.9 01/04/2015   HGB 13.8 01/04/2015   HCT 41.0 01/04/2015   MCV 88.6 01/04/2015   PLT 238 01/04/2015    Lab Results  Component Value Date   CREATININE 1.34 (H) 01/04/2015    No results found for: PSA  No results found for: TESTOSTERONE  No results found for: HGBA1C  Urinalysis    Component Value Date/Time   APPEARANCEUR Hazy (A) 12/22/2015 0922   GLUCOSEU Negative 12/22/2015 0922   BILIRUBINUR Negative 12/22/2015 0922   PROTEINUR 2+ (A) 12/22/2015 0922   NITRITE Negative 12/22/2015 0922   LEUKOCYTESUR Trace (A) 12/22/2015 0922    Pertinent Imaging: none  Assessment & Plan:    1. BPH (benign prostatic hyperplasia) -We discussed foley catheter placement and the patient refuses. He will continue double voiding -RTC 1 month - Bladder Scan (Post Void Residual) in office   No Follow-up on file.  Nicolette Bang, MD  [facility] [address], [place], [place] [address] [phone]

## 2016-01-28 ENCOUNTER — Telehealth: Payer: Self-pay | Admitting: Urology

## 2016-01-28 NOTE — Telephone Encounter (Signed)
Pt called and wanted to change his appt to later in September.  I told him you weren't here another day until 10/3.  He wanted to know if it would be O.K. For him to wait that long.  Please advise and I will call pt and let him know.  Thanks.

## 2016-02-02 ENCOUNTER — Encounter: Payer: Self-pay | Admitting: Urology

## 2016-02-02 ENCOUNTER — Ambulatory Visit (INDEPENDENT_AMBULATORY_CARE_PROVIDER_SITE_OTHER): Payer: BLUE CROSS/BLUE SHIELD | Admitting: Urology

## 2016-02-02 VITALS — BP 140/76 | HR 64 | Ht 72.0 in | Wt 221.0 lb

## 2016-02-02 DIAGNOSIS — R338 Other retention of urine: Secondary | ICD-10-CM

## 2016-02-02 DIAGNOSIS — N400 Enlarged prostate without lower urinary tract symptoms: Secondary | ICD-10-CM | POA: Diagnosis not present

## 2016-02-02 DIAGNOSIS — N2889 Other specified disorders of kidney and ureter: Secondary | ICD-10-CM | POA: Diagnosis not present

## 2016-02-02 DIAGNOSIS — N401 Enlarged prostate with lower urinary tract symptoms: Secondary | ICD-10-CM

## 2016-02-02 LAB — BLADDER SCAN AMB NON-IMAGING: Scan Result: 750

## 2016-02-02 NOTE — Progress Notes (Signed)
02/02/2016 11:53 AM   Charles Turner 19-Feb-1961 [id number]  Referring provider: Glean Hess, MD [address] [place], [place] [address]  Chief Complaint  Patient presents with  . Benign Prostatic Hypertrophy    HPI: Mr Charles Turner is a 55yo seen today for BPH with LUTS. He was previously seen by Dr. Elnoria Howard for BPH and microhematuria. He has undergone hematuria workup x2 which was negative for malignancy per Dr. Guinevere Ferrari notes. He has a large bladder diverticulum. He was supposed to undergo UDS by Dr. Matilde Sprang but this was never done due to patient not wanting to have the test. He is currently on BID flomax with poor results. He has severe urgency, frequency, weak stream and nocturia 4-5x. He ran out of his flomax 3 weeks ago.   PVR >800cc  Interval hx: Mr Charles Turner returns today and continues to have issues with urgency, frequency and incomplete emptying. He is taking rapaflo and finasteride.    PVR is 750cc His symptoms have not improved.    PMH: No past medical history on file.  Surgical History: No past surgical history on file.  Home Medications:    Medication List       Accurate as of 02/02/16 11:53 AM. Always use your most recent med list.          finasteride 5 MG tablet Commonly known as:  PROSCAR Take 1 tablet (5 mg total) by mouth daily.   PARoxetine 25 MG 24 hr tablet Commonly known as:  PAXIL-CR TAKE 1 TABLET (25 MG TOTAL) BY MOUTH DAILY.   silodosin 8 MG Caps capsule Commonly known as:  RAPAFLO Take 1 capsule (8 mg total) by mouth daily with breakfast.   tamsulosin 0.4 MG Caps capsule Commonly known as:  FLOMAX Take 1 capsule (0.4 mg total) by mouth 2 (two) times daily.   VIAGRA 100 MG tablet Generic drug:  sildenafil TAKE 1 TABLET BY MOUTH EVERY DAY AS NEEDED       Allergies:  Allergies  Allergen Reactions  . Bee Venom Anaphylaxis  . Wasp Venom Anaphylaxis    Family History: Family History  Problem Relation Age of Onset    . Adopted: Yes  . Family history unknown: Yes    Social History:  reports that he has never smoked. He has quit using smokeless tobacco. His smokeless tobacco use included Chew. He reports that he does not drink alcohol. His drug history is not on file.  ROS:                                        Physical Exam: There were no vitals taken for this visit.  Constitutional:  Alert and oriented, No acute distress. HEENT:  AT, moist mucus membranes.  Trachea midline, no masses. Cardiovascular: No clubbing, cyanosis, or edema. Respiratory: Normal respiratory effort, no increased work of breathing. GI: Abdomen is soft, nontender, nondistended, no abdominal masses GU: No CVA tenderness.  Skin: No rashes, bruises or suspicious lesions. Lymph: No cervical or inguinal adenopathy. Neurologic: Grossly intact, no focal deficits, moving all 4 extremities. Psychiatric: Normal mood and affect.  Laboratory Data: Lab Results  Component Value Date   WBC 8.9 01/04/2015   HGB 13.8 01/04/2015   HCT 41.0 01/04/2015   MCV 88.6 01/04/2015   PLT 238 01/04/2015    Lab Results  Component Value Date   CREATININE 1.34 (H) 01/04/2015  No results found for: PSA  No results found for: TESTOSTERONE  No results found for: HGBA1C  Urinalysis    Component Value Date/Time   APPEARANCEUR Hazy (A) 12/22/2015 0922   GLUCOSEU Negative 12/22/2015 0922   BILIRUBINUR Negative 12/22/2015 0922   PROTEINUR 2+ (A) 12/22/2015 0922   NITRITE Negative 12/22/2015 0922   LEUKOCYTESUR Trace (A) 12/22/2015 0922    Pertinent Imaging: none  Assessment & Plan:   1. BPH with LUTS -rapaflo 8mg  and finasteride 5mg  -I recommended foley catheter placement and the patient defers at this time. We also discussed TURP versus HoLEP and the patient wishes to proceed with HoLEP but he wishes to defer treatment for 2-3 months We discussed renal impairment due to chronic obstruction.   RTC oct with  BMP and for PVR There are no diagnoses linked to this encounter.  No Follow-up on file.  Nicolette Bang, MD  [facility] [address] [place], [place] [address] [phone]

## 2016-03-10 ENCOUNTER — Other Ambulatory Visit: Payer: Self-pay | Admitting: Internal Medicine

## 2016-03-14 ENCOUNTER — Telehealth: Payer: Self-pay | Admitting: Internal Medicine

## 2016-03-14 NOTE — Telephone Encounter (Signed)
Patient was scheduled to have labs (BMP) drawn for our office on 03/15/2016.  Patient called the office and requested to have these labs drawn at his office visit with Dr. Army Melia on 03/15/16.  Is this possible?  If not, I will contact patient to reschedule labs.

## 2016-03-14 NOTE — Telephone Encounter (Signed)
No problem.  He coming in for a CPX and I will draw those labs.  You should be able to see them in the chart after tomorrow.

## 2016-03-15 ENCOUNTER — Encounter: Payer: Self-pay | Admitting: Internal Medicine

## 2016-03-15 ENCOUNTER — Other Ambulatory Visit: Payer: Self-pay | Admitting: Internal Medicine

## 2016-03-15 ENCOUNTER — Other Ambulatory Visit: Payer: BLUE CROSS/BLUE SHIELD

## 2016-03-15 ENCOUNTER — Ambulatory Visit (INDEPENDENT_AMBULATORY_CARE_PROVIDER_SITE_OTHER): Payer: BLUE CROSS/BLUE SHIELD | Admitting: Internal Medicine

## 2016-03-15 VITALS — BP 123/84 | HR 67 | Resp 16 | Ht 72.0 in | Wt 221.0 lb

## 2016-03-15 DIAGNOSIS — E7849 Other hyperlipidemia: Secondary | ICD-10-CM

## 2016-03-15 DIAGNOSIS — D485 Neoplasm of uncertain behavior of skin: Secondary | ICD-10-CM | POA: Diagnosis not present

## 2016-03-15 DIAGNOSIS — Z91030 Bee allergy status: Secondary | ICD-10-CM

## 2016-03-15 DIAGNOSIS — E784 Other hyperlipidemia: Secondary | ICD-10-CM | POA: Diagnosis not present

## 2016-03-15 DIAGNOSIS — R7989 Other specified abnormal findings of blood chemistry: Secondary | ICD-10-CM | POA: Diagnosis not present

## 2016-03-15 DIAGNOSIS — Z1211 Encounter for screening for malignant neoplasm of colon: Secondary | ICD-10-CM | POA: Diagnosis not present

## 2016-03-15 DIAGNOSIS — F419 Anxiety disorder, unspecified: Secondary | ICD-10-CM

## 2016-03-15 DIAGNOSIS — M170 Bilateral primary osteoarthritis of knee: Secondary | ICD-10-CM | POA: Diagnosis not present

## 2016-03-15 DIAGNOSIS — B354 Tinea corporis: Secondary | ICD-10-CM | POA: Diagnosis not present

## 2016-03-15 DIAGNOSIS — Z0000 Encounter for general adult medical examination without abnormal findings: Secondary | ICD-10-CM | POA: Diagnosis not present

## 2016-03-15 DIAGNOSIS — Z91038 Other insect allergy status: Secondary | ICD-10-CM

## 2016-03-15 DIAGNOSIS — R945 Abnormal results of liver function studies: Secondary | ICD-10-CM

## 2016-03-15 LAB — POCT URINALYSIS DIPSTICK
Bilirubin, UA: NEGATIVE
GLUCOSE UA: NEGATIVE
Ketones, UA: NEGATIVE
Leukocytes, UA: NEGATIVE
NITRITE UA: NEGATIVE
RBC UA: NEGATIVE
Spec Grav, UA: 1.015
UROBILINOGEN UA: 0.2
pH, UA: 7

## 2016-03-15 MED ORDER — PAROXETINE HCL ER 25 MG PO TB24: 25.0000 mg | ORAL_TABLET | Freq: Every day | ORAL | 12 refills | Status: DC

## 2016-03-15 MED ORDER — PAROXETINE HCL ER 25 MG PO TB24: 25.0000 mg | ORAL_TABLET | Freq: Every day | ORAL | 3 refills | Status: DC

## 2016-03-15 NOTE — Patient Instructions (Signed)
Send Cologuard back early in the week  Consult Dermatology for Skin Survey

## 2016-03-15 NOTE — Progress Notes (Signed)
Date:  03/15/2016   Name:  Charles Turner   DOB:  1960/09/17   MRN:  [id number]   Chief Complaint: Annual Exam Charles Turner is a 55 y.o. male who presents today for his Complete Annual Exam. He feels well. He reports exercising regularly. He reports he is sleeping fairly well.   He is followed by Urology for BPH and significant urinary retention for which he takes 2 medications.  There is concern about renal function decline so he needs labs done.  His bee sting allergy remains a concern.  He has epipens with him at all times as well as an emergency call bracelet.  He has decided for now not to pursue de-sensitization therapy.  Anxiety is stable on Paxil.  His wife feels strongly that it is very helpful.  He denies side effects other than vivid dreams.   Skin concerns - several scaly lesion on scalp.  Multiple moles on back that he can not see.  He also has an itchy rash on right arm - there for 2 years - not spreading and has not responded to benedryl or cortisone topical.  Review of Systems  Constitutional: Negative for appetite change, chills, diaphoresis, fatigue, fever and unexpected weight change.  HENT: Negative for hearing loss, tinnitus, trouble swallowing and voice change.   Eyes: Negative for visual disturbance.  Respiratory: Negative for choking, shortness of breath and wheezing.   Cardiovascular: Negative for chest pain, palpitations and leg swelling.  Gastrointestinal: Negative for abdominal pain, blood in stool, constipation and diarrhea.  Genitourinary: Positive for difficulty urinating. Negative for dysuria, frequency, hematuria and urgency.  Musculoskeletal: Positive for arthralgias (knees). Negative for back pain and myalgias.  Skin: Positive for rash (several lesion on scalp and multiple moles on back.  Also itchy area on right arm present for 2 years.). Negative for color change.  Neurological: Negative for dizziness, syncope and headaches.  Hematological:  Negative for adenopathy.  Psychiatric/Behavioral: Negative for dysphoric mood and sleep disturbance. The patient is not nervous/anxious.     Patient Active Problem List   Diagnosis Date Noted  . Nocturia 12/22/2015  . Urinary retention due to benign prostatic hyperplasia 12/22/2015  . Chronic anxiety 06/17/2015  . Bee sting allergy 06/17/2015  . Familial multiple lipoprotein-type hyperlipidemia 05/05/2015  . Benign enlargement of prostate 05/05/2015  . Back ache 05/05/2015  . Bladder neoplasm 05/05/2015  . Abnormal LFTs 05/05/2015  . Failure of erection 05/05/2015  . Blood in the urine 05/05/2015  . 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 Authorizing Provider  finasteride (PROSCAR) 5 MG tablet Take 1 tablet (5 mg total) by mouth daily. 12/22/15 12/21/16 Yes Cleon Gustin, MD  PARoxetine (PAXIL-CR) 25 MG 24 hr tablet TAKE 1 TABLET (25 MG TOTAL) BY MOUTH DAILY. 03/10/16  Yes Glean Hess, MD  silodosin (RAPAFLO) 8 MG CAPS capsule Take 1 capsule (8 mg total) by mouth daily with breakfast. 12/22/15  Yes Cleon Gustin, MD  VIAGRA 100 MG tablet TAKE 1 TABLET BY MOUTH EVERY DAY AS NEEDED 03/23/15  Yes Glean Hess, MD    Allergies  Allergen Reactions  . Bee Venom Anaphylaxis  . Wasp Venom Anaphylaxis    History reviewed. No pertinent surgical history.  Social History  Substance Use Topics  . Smoking status: Never Smoker  . Smokeless tobacco: Former Systems developer    Types: Chew  . Alcohol use No  Medication list has been reviewed and updated.   Physical Exam  Constitutional: He is oriented to person, place, and time. He appears well-developed and well-nourished.  HENT:  Head: Normocephalic.  Right Ear: Tympanic membrane, external ear and ear canal normal.  Left Ear: Tympanic membrane, external ear and ear canal normal.  Nose: Nose normal.  Mouth/Throat: Uvula is midline and oropharynx is clear and  moist.  Eyes: Conjunctivae and EOM are normal. Pupils are equal, round, and reactive to light.  Neck: Normal range of motion. Neck supple. Carotid bruit is not present. No thyromegaly present.  Cardiovascular: Normal rate, regular rhythm, normal heart sounds and intact distal pulses.   Pulmonary/Chest: Effort normal and breath sounds normal. He has no wheezes. Right breast exhibits no mass. Left breast exhibits no mass.  Abdominal: Soft. Normal appearance and bowel sounds are normal. There is no hepatosplenomegaly. There is no tenderness.  Musculoskeletal: Normal range of motion. He exhibits no edema, tenderness or deformity.       Right knee: Normal. He exhibits no swelling, no effusion and no ecchymosis.       Left knee: Normal. He exhibits no swelling, no effusion and no ecchymosis.  Lymphadenopathy:    He has no cervical adenopathy.  Neurological: He is alert and oriented to person, place, and time. He has normal reflexes.  Skin: Skin is warm, dry and intact.  Three AK on scalp.  Dry, papular rash on right anterior arm; one area of superficial excoriation.  Psychiatric: He has a normal mood and affect. His speech is normal and behavior is normal. Judgment and thought content normal.  Nursing note and vitals reviewed.   BP 123/84   Pulse 67   Resp 16   Ht 6' (1.829 m)   Wt 221 lb (100.2 kg)   SpO2 97%   BMI 29.97 kg/m   Assessment and Plan: 1. Annual physical exam Normal exam Follow up with Urology - CBC with Differential/Platelet  2. Familial multiple lipoprotein-type hyperlipidemia Will advise if medication needed - Lipid panel  3. Abnormal LFTs - Comprehensive metabolic panel  4. Bee sting allergy Keep EpiPen on hand at all times Hold off on de-sensitization therapy  5. Chronic anxiety Doing well on Paxil - TSH - PARoxetine (PAXIL-CR) 25 MG 24 hr tablet; Take 1 tablet (25 mg total) by mouth daily.  Dispense: 30 tablet; Refill: 12  6. Primary osteoarthritis of  both knees Mild - consult Orthopedics if needed  7. Tinea corporis Recommend otc antifungal application twice a day  8. Neoplasm of uncertain behavior of skin Dermatology consult  9. Colon cancer screening - Cologuard   Halina Maidens, MD [facility]  03/15/2016

## 2016-03-16 LAB — COMPREHENSIVE METABOLIC PANEL
A/G RATIO: 1.8 (ref 1.2–2.2)
ALT: 25 IU/L (ref 0–44)
AST: 22 IU/L (ref 0–40)
Albumin: 4.5 g/dL (ref 3.5–5.5)
Alkaline Phosphatase: 59 IU/L (ref 39–117)
BILIRUBIN TOTAL: 0.7 mg/dL (ref 0.0–1.2)
BUN/Creatinine Ratio: 15 (ref 9–20)
BUN: 18 mg/dL (ref 6–24)
CHLORIDE: 98 mmol/L (ref 96–106)
CO2: 28 mmol/L (ref 18–29)
Calcium: 9.9 mg/dL (ref 8.7–10.2)
Creatinine, Ser: 1.18 mg/dL (ref 0.76–1.27)
GFR calc Af Amer: 80 mL/min/{1.73_m2} (ref >59)
GFR calc non Af Amer: 70 mL/min/{1.73_m2} (ref >59)
GLUCOSE: 101 mg/dL — AB (ref 65–99)
Globulin, Total: 2.5 g/dL (ref 1.5–4.5)
POTASSIUM: 5.1 mmol/L (ref 3.5–5.2)
Sodium: 141 mmol/L (ref 134–144)
Total Protein: 7 g/dL (ref 6.0–8.5)

## 2016-03-16 LAB — CBC WITH DIFFERENTIAL/PLATELET
BASOS ABS: 0 10*3/uL (ref 0.0–0.2)
BASOS: 0 %
EOS (ABSOLUTE): 0.1 10*3/uL (ref 0.0–0.4)
Eos: 1 %
Hematocrit: 44.2 % (ref 37.5–51.0)
Hemoglobin: 15.3 g/dL (ref 12.6–17.7)
Immature Grans (Abs): 0 10*3/uL (ref 0.0–0.1)
Immature Granulocytes: 0 %
Lymphocytes Absolute: 2.7 10*3/uL (ref 0.7–3.1)
Lymphs: 38 %
MCH: 30.1 pg (ref 26.6–33.0)
MCHC: 34.6 g/dL (ref 31.5–35.7)
MCV: 87 fL (ref 79–97)
MONOS ABS: 0.5 10*3/uL (ref 0.1–0.9)
Monocytes: 7 %
Neutrophils Absolute: 3.7 10*3/uL (ref 1.4–7.0)
Neutrophils: 54 %
Platelets: 304 10*3/uL (ref 150–379)
RBC: 5.09 x10E6/uL (ref 4.14–5.80)
RDW: 13.5 % (ref 12.3–15.4)
WBC: 7.1 10*3/uL (ref 3.4–10.8)

## 2016-03-16 LAB — LIPID PANEL
Chol/HDL Ratio: 5.6 ratio units — ABNORMAL HIGH (ref 0.0–5.0)
Cholesterol, Total: 322 mg/dL — ABNORMAL HIGH (ref 100–199)
HDL: 58 mg/dL (ref >39)
LDL Calculated: 209 mg/dL — ABNORMAL HIGH (ref 0–99)
TRIGLYCERIDES: 277 mg/dL — AB (ref 0–149)
VLDL Cholesterol Cal: 55 mg/dL — ABNORMAL HIGH (ref 5–40)

## 2016-03-16 LAB — TSH: TSH: 3.62 u[IU]/mL (ref 0.450–4.500)

## 2016-03-22 ENCOUNTER — Ambulatory Visit: Payer: BLUE CROSS/BLUE SHIELD

## 2016-03-24 NOTE — Telephone Encounter (Signed)
-----   Message from Lazarus Gowda, [place] sent at 03/16/2016  4:50 PM EDT ----- Regarding: Labs  Please let the Urologist who follows him see these results. Thanks chic.

## 2016-03-24 NOTE — Telephone Encounter (Signed)
Please review the pt labs from his primary care Dr. Army Melia.

## 2016-03-28 ENCOUNTER — Other Ambulatory Visit: Payer: Self-pay | Admitting: Internal Medicine

## 2016-03-28 MED ORDER — ATORVASTATIN CALCIUM 10 MG PO TABS: 10.0000 mg | ORAL_TABLET | Freq: Every day | ORAL | 1 refills | Status: DC

## 2016-03-28 NOTE — Progress Notes (Deleted)
error 

## 2016-04-29 LAB — COLOGUARD

## 2016-05-13 ENCOUNTER — Other Ambulatory Visit: Payer: Self-pay

## 2016-05-13 DIAGNOSIS — N401 Enlarged prostate with lower urinary tract symptoms: Secondary | ICD-10-CM

## 2016-05-13 MED ORDER — SILODOSIN 8 MG PO CAPS: 8.0000 mg | ORAL_CAPSULE | Freq: Every day | ORAL | 3 refills | Status: DC

## 2016-05-16 ENCOUNTER — Other Ambulatory Visit: Payer: Self-pay | Admitting: Internal Medicine

## 2016-05-16 ENCOUNTER — Telehealth: Payer: Self-pay | Admitting: Internal Medicine

## 2016-05-16 MED ORDER — EPINEPHRINE 0.3 MG/0.3ML IJ SOAJ: 0.3000 mg | Freq: Once | INTRAMUSCULAR | 0 refills | Status: AC

## 2016-05-16 NOTE — Telephone Encounter (Signed)
Pt called need refill on Epi pen at [facility]

## 2016-06-01 ENCOUNTER — Telehealth: Payer: Self-pay | Admitting: Internal Medicine

## 2016-06-01 NOTE — Telephone Encounter (Signed)
Filing claim per [employer] is paying for Cologaurd.

## 2016-06-01 NOTE — Telephone Encounter (Signed)
Pt called stating his insurance denied claim for colon guard. Dr. Army Melia wants you to call the colon guard people just make sure they have the right insurance and call the pt for more info.

## 2016-08-15 ENCOUNTER — Telehealth: Payer: Self-pay

## 2016-08-15 NOTE — Telephone Encounter (Signed)
Pt called requesting for Alfuzosin to be sent to Pharm[facility] in [place]. [facility] wants him to be seen for visit, and it costs him hundreds of dollars to see them... Would like this medication in replace of Rapaflo. Appointment needed here? Last seen for Physical In October.

## 2016-08-15 NOTE — Telephone Encounter (Signed)
No.  I am not familiar with this medication and per the Urology notes, he really needs to be seen for follow up.

## 2016-08-16 NOTE — Telephone Encounter (Signed)
Informed pt needs to be seen at Urology for meds.

## 2016-09-17 ENCOUNTER — Other Ambulatory Visit: Payer: Self-pay | Admitting: Internal Medicine

## 2016-09-17 DIAGNOSIS — E7849 Other hyperlipidemia: Secondary | ICD-10-CM

## 2016-09-19 NOTE — Telephone Encounter (Signed)
Called and left VM to inform pt to come in this week to get labs done for cholesterol medication.

## 2016-09-23 ENCOUNTER — Other Ambulatory Visit: Payer: Self-pay

## 2016-09-23 ENCOUNTER — Other Ambulatory Visit: Payer: Self-pay | Admitting: Internal Medicine

## 2016-09-23 DIAGNOSIS — N401 Enlarged prostate with lower urinary tract symptoms: Secondary | ICD-10-CM

## 2016-09-23 DIAGNOSIS — E7849 Other hyperlipidemia: Secondary | ICD-10-CM

## 2016-09-23 DIAGNOSIS — R338 Other retention of urine: Secondary | ICD-10-CM

## 2016-09-24 LAB — BASIC METABOLIC PANEL
BUN / CREAT RATIO: 13 (ref 9–20)
BUN: 16 mg/dL (ref 6–24)
CO2: 24 mmol/L (ref 18–29)
CREATININE: 1.24 mg/dL (ref 0.76–1.27)
Calcium: 9.9 mg/dL (ref 8.7–10.2)
Chloride: 101 mmol/L (ref 96–106)
GFR calc Af Amer: 76 mL/min/{1.73_m2} (ref >59)
GFR, EST NON AFRICAN AMERICAN: 65 mL/min/{1.73_m2} (ref >59)
Glucose: 119 mg/dL — ABNORMAL HIGH (ref 65–99)
POTASSIUM: 4.4 mmol/L (ref 3.5–5.2)
SODIUM: 141 mmol/L (ref 134–144)

## 2016-09-24 LAB — LIPID PANEL
CHOL/HDL RATIO: 3.9 ratio (ref 0.0–5.0)
Cholesterol, Total: 198 mg/dL (ref 100–199)
HDL: 51 mg/dL (ref >39)
LDL CALC: 79 mg/dL (ref 0–99)
Triglycerides: 341 mg/dL — ABNORMAL HIGH (ref 0–149)
VLDL Cholesterol Cal: 68 mg/dL — ABNORMAL HIGH (ref 5–40)

## 2016-09-24 LAB — HEPATIC FUNCTION PANEL
ALT: 23 IU/L (ref 0–44)
AST: 20 IU/L (ref 0–40)
Albumin: 4.5 g/dL (ref 3.5–5.5)
Alkaline Phosphatase: 55 IU/L (ref 39–117)
Bilirubin Total: 0.5 mg/dL (ref 0.0–1.2)
Bilirubin, Direct: 0.13 mg/dL (ref 0.00–0.40)
Total Protein: 6.7 g/dL (ref 6.0–8.5)

## 2016-09-28 ENCOUNTER — Other Ambulatory Visit: Payer: Self-pay

## 2016-12-11 ENCOUNTER — Ambulatory Visit
Admission: EM | Admit: 2016-12-11 | Discharge: 2016-12-11 | Disposition: A | Discharge status: Home / Self Care | Payer: BLUE CROSS/BLUE SHIELD | Attending: Family Medicine | Admitting: Family Medicine

## 2016-12-11 DIAGNOSIS — J0100 Acute maxillary sinusitis, unspecified: Secondary | ICD-10-CM

## 2016-12-11 DIAGNOSIS — R05 Cough: Secondary | ICD-10-CM | POA: Diagnosis not present

## 2016-12-11 HISTORY — DX: Pure hypercholesterolemia, unspecified: E78.00

## 2016-12-11 HISTORY — DX: Benign prostatic hyperplasia without lower urinary tract symptoms: N40.0

## 2016-12-11 HISTORY — DX: Anxiety disorder, unspecified: F41.9

## 2016-12-11 MED ORDER — HYDROCOD POLST-CPM POLST ER 10-8 MG/5ML PO SUER: 5.0000 mL | Freq: Two times a day (BID) | ORAL | 0 refills | Status: DC | PRN

## 2016-12-11 MED ORDER — AMOXICILLIN 875 MG PO TABS: 875.0000 mg | ORAL_TABLET | Freq: Two times a day (BID) | ORAL | 0 refills | Status: DC

## 2016-12-11 NOTE — ED Triage Notes (Signed)
Pt with headache, sore throat, nasal congestion, and cough. Started while he was in [place] a few days ago. Pain 5/10

## 2016-12-11 NOTE — ED Provider Notes (Signed)
[facility]    CSN: [id number] Arrival date & time: 12/11/16  0240     History   Chief Complaint Chief Complaint  Patient presents with  . Nasal Congestion    HPI Charles Turner is a 56 y.o. male.   The history is provided by the patient.  URI  Presenting symptoms: congestion, cough, facial pain, fever, rhinorrhea and sore throat   Severity:  Moderate Onset quality:  Sudden Duration:  5 days Timing:  Constant Progression:  Worsening Chronicity:  New Relieved by:  Nothing Ineffective treatments:  OTC medications Associated symptoms: headaches and sinus pain   Associated symptoms: no wheezing   Risk factors: recent travel and sick contacts   Risk factors: not elderly, no chronic cardiac disease, no chronic kidney disease, no chronic respiratory disease, no diabetes mellitus, no immunosuppression and no recent illness     Past Medical History:  Diagnosis Date  . Anxiety   . High cholesterol   . Prostate enlargement     Patient Active Problem List   Diagnosis Date Noted  . Primary osteoarthritis of both knees 03/15/2016  . Nocturia 12/22/2015  . Urinary retention due to benign prostatic hyperplasia 12/22/2015  . Chronic anxiety 06/17/2015  . Bee sting allergy 06/17/2015  . Familial multiple lipoprotein-type hyperlipidemia 05/05/2015  . Benign enlargement of prostate 05/05/2015  . Back ache 05/05/2015  . Bladder neoplasm 05/05/2015  . Abnormal LFTs 05/05/2015  . Failure of erection 05/05/2015  . Blood in the urine 05/05/2015  . Eunuchoidism 05/05/2015    History reviewed. No pertinent surgical history.     Home Medications    Prior to Admission medications   Medication Sig Start Date End Date Taking? Authorizing Provider  amoxicillin (AMOXIL) 875 MG tablet Take 1 tablet (875 mg total) by mouth 2 (two) times daily. 12/11/16   Norval Gable, MD  atorvastatin (LIPITOR) 10 MG tablet TAKE 1 TABLET (10 MG TOTAL) BY MOUTH DAILY. 09/17/16    Glean Hess, MD  chlorpheniramine-HYDROcodone [facility] PENNKINETIC ER) 10-8 MG/5ML SUER Take 5 mLs by mouth every 12 (twelve) hours as needed. 12/11/16   Norval Gable, MD  finasteride (PROSCAR) 5 MG tablet Take 1 tablet (5 mg total) by mouth daily. 12/22/15 12/21/16  Cleon Gustin, MD  PARoxetine (PAXIL-CR) 25 MG 24 hr tablet Take 1 tablet (25 mg total) by mouth daily. 03/15/16   Glean Hess, MD  silodosin (RAPAFLO) 8 MG CAPS capsule Take 1 capsule (8 mg total) by mouth daily with breakfast. 05/13/16   McKenzie, Candee Furbish, MD  VIAGRA 100 MG tablet TAKE 1 TABLET BY MOUTH EVERY DAY AS NEEDED 03/23/15   Glean Hess, MD    Family History Family History  Problem Relation Age of Onset  . Adopted: Yes  . Family history unknown: Yes    Social History Social History  Substance Use Topics  . Smoking status: Never Smoker  . Smokeless tobacco: Former Systems developer    Types: Chew  . Alcohol use No     Allergies   Bee venom and Wasp venom   Review of Systems Review of Systems  Constitutional: Positive for fever.  HENT: Positive for congestion, rhinorrhea, sinus pain and sore throat.   Respiratory: Positive for cough. Negative for wheezing.   Neurological: Positive for headaches.     Physical Exam Triage Vital Signs ED Triage Vitals  Enc Vitals Group     BP 12/11/16 0949 (!) 142/75     Pulse Rate 12/11/16 0949 Marland Kitchen)  101     Resp 12/11/16 0949 18     Temp 12/11/16 0949 99.5 F (37.5 C)     Temp Source 12/11/16 0949 Oral     SpO2 12/11/16 0949 97 %     Weight 12/11/16 0949 216 lb (98 kg)     Height 12/11/16 0949 6' (1.829 m)     Head Circumference --      Peak Flow --      Pain Score 12/11/16 0951 5     Pain Loc --      Pain Edu? --      Excl. in [place]? --    No data found.   Updated Vital Signs BP (!) 142/75 (BP Location: Left Arm)   Pulse (!) 101   Temp 99.5 F (37.5 C) (Oral)   Resp 18   Ht 6' (1.829 m)   Wt 216 lb (98 kg)   SpO2 97%   BMI 29.29  kg/m   Visual Acuity Right Eye Distance:   Left Eye Distance:   Bilateral Distance:    Right Eye Near:   Left Eye Near:    Bilateral Near:     Physical Exam  Constitutional: He appears well-developed and well-nourished. No distress.  HENT:  Head: Normocephalic and atraumatic.  Right Ear: Tympanic membrane, external ear and ear canal normal.  Left Ear: Tympanic membrane, external ear and ear canal normal.  Nose: Mucosal edema and rhinorrhea present. Right sinus exhibits maxillary sinus tenderness. Left sinus exhibits maxillary sinus tenderness.  Mouth/Throat: Uvula is midline, oropharynx is clear and moist and mucous membranes are normal. No oropharyngeal exudate or tonsillar abscesses.  Eyes: Pupils are equal, round, and reactive to light. Conjunctivae and EOM are normal. Right eye exhibits no discharge. Left eye exhibits no discharge. No scleral icterus.  Neck: Normal range of motion. Neck supple. No tracheal deviation present. No thyromegaly present.  Cardiovascular: Normal rate, regular rhythm and normal heart sounds.   Pulmonary/Chest: Effort normal and breath sounds normal. No stridor. No respiratory distress. He has no wheezes. He has no rales. He exhibits no tenderness.  Lymphadenopathy:    He has no cervical adenopathy.  Neurological: He is alert.  Skin: Skin is warm and dry. No rash noted. He is not diaphoretic.  Nursing note and vitals reviewed.    UC Treatments / Results  Labs (all labs ordered are listed, but only abnormal results are displayed) Labs Reviewed - No data to display  EKG  EKG Interpretation None       Radiology No results found.  Procedures Procedures (including critical care time)  Medications Ordered in UC Medications - No data to display   Initial Impression / Assessment and Plan / UC Course  I have reviewed the triage vital signs and the nursing notes.  Pertinent labs & imaging results that were available during my care of the  patient were reviewed by me and considered in my medical decision making (see chart for details).       Final Clinical Impressions(s) / UC Diagnoses   Final diagnoses:  Acute maxillary sinusitis, recurrence not specified    New Prescriptions Discharge Medication List as of 12/11/2016 10:48 AM    START taking these medications   Details  amoxicillin (AMOXIL) 875 MG tablet Take 1 tablet (875 mg total) by mouth 2 (two) times daily., Starting Sun 12/11/2016, Normal    chlorpheniramine-HYDROcodone (TUSSIONEX PENNKINETIC ER) 10-8 MG/5ML SUER Take 5 mLs by mouth every 12 (twelve) hours as needed.,  Starting Sun 12/11/2016, Normal       1. diagnosis reviewed with patient 2. rx as per orders above; reviewed possible side effects, interactions, risks and benefits  3. Recommend supportive treatment with otc flonase 4. Follow-up prn if symptoms worsen or don't improve   Norval Gable, MD 12/11/16 1301

## 2016-12-14 ENCOUNTER — Telehealth: Payer: Self-pay

## 2016-12-14 NOTE — Telephone Encounter (Signed)
Sinus infections can last up to 10 days and the cough can last up to 2 weeks.  He should take all the antibiotics and use the cough syrup as needed.

## 2016-12-14 NOTE — Telephone Encounter (Signed)
Pt informed - call back If no better after antibiotics are finished .

## 2016-12-14 NOTE — Telephone Encounter (Signed)
Patient called and left Vm- was seen in urgent care this past Sunday. Was given amoxicillin and Cough Syrup for Sinusitis. Pt states fever has subsided and so has headaches but otherwise still not feeling any better with bad cough at night and congestion. Pt has only taken antibiotics since Sunday night. Pt requesating other advice. Please advise.

## 2016-12-15 ENCOUNTER — Encounter: Payer: Self-pay | Admitting: Internal Medicine

## 2016-12-15 ENCOUNTER — Ambulatory Visit (INDEPENDENT_AMBULATORY_CARE_PROVIDER_SITE_OTHER): Payer: BLUE CROSS/BLUE SHIELD | Admitting: Internal Medicine

## 2016-12-15 ENCOUNTER — Other Ambulatory Visit: Payer: Self-pay | Admitting: Internal Medicine

## 2016-12-15 VITALS — BP 142/84 | HR 69 | Temp 98.0°F | Ht 72.0 in | Wt 217.0 lb

## 2016-12-15 DIAGNOSIS — J019 Acute sinusitis, unspecified: Secondary | ICD-10-CM

## 2016-12-15 MED ORDER — BENZONATATE 200 MG PO CAPS: 200.0000 mg | ORAL_CAPSULE | Freq: Three times a day (TID) | ORAL | 0 refills | Status: DC | PRN

## 2016-12-15 MED ORDER — AMOXICILLIN 875 MG PO TABS: 875.0000 mg | ORAL_TABLET | Freq: Two times a day (BID) | ORAL | 0 refills | Status: DC

## 2016-12-15 NOTE — Patient Instructions (Signed)
Sudafed (pseudophedrine) 30 mg twice a day for congestion

## 2016-12-15 NOTE — Progress Notes (Signed)
Date:  12/15/2016   Name:  Charles Turner   DOB:  11-06-1960   MRN:  [id number]   Chief Complaint: Cough (Not getting any better. Has coughing fit everynight. Still taking augmentin and cough syrup. Sore throat and headache has gotten better. Wants different cough medicine. Yellow/ green production with cough not clearing up.  ) Cough  This is a new problem. The problem has been unchanged. The problem occurs every few minutes. The cough is non-productive. Associated symptoms include postnasal drip and a sore throat. Pertinent negatives include no chest pain, chills, fever or wheezing.  Seen by UC and treated with Amoxicillin and Tussionex.  He is not taking any decongestant, flonase, etc.  He is still coughing frequently but his headache and fever have resolved. He is concerned about having a cough for a wedding shower this weekend.   Review of Systems  Constitutional: Negative for chills, fatigue and fever.  HENT: Positive for congestion, postnasal drip, sinus pressure, sore throat and voice change. Negative for trouble swallowing.   Respiratory: Positive for cough. Negative for chest tightness and wheezing.   Cardiovascular: Negative for chest pain.    Patient Active Problem List   Diagnosis Date Noted  . Primary osteoarthritis of both knees 03/15/2016  . Nocturia 12/22/2015  . Urinary retention due to benign prostatic hyperplasia 12/22/2015  . Chronic anxiety 06/17/2015  . Bee sting allergy 06/17/2015  . Familial multiple lipoprotein-type hyperlipidemia 05/05/2015  . Benign enlargement of prostate 05/05/2015  . Back ache 05/05/2015  . Bladder neoplasm 05/05/2015  . Abnormal LFTs 05/05/2015  . Failure of erection 05/05/2015  . Blood in the urine 05/05/2015  . 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 Authorizing Provider  amoxicillin (AMOXIL) 875 MG tablet Take 1 tablet (875 mg total) by mouth 2 (two) times  daily. 12/11/16  Yes Conty, Linward Foster, MD  atorvastatin (LIPITOR) 10 MG tablet TAKE 1 TABLET (10 MG TOTAL) BY MOUTH DAILY. 12/15/16  Yes Glean Hess, MD  chlorpheniramine-HYDROcodone [facility] PENNKINETIC ER) 10-8 MG/5ML SUER Take 5 mLs by mouth every 12 (twelve) hours as needed. 12/11/16  Yes Norval Gable, MD  finasteride (PROSCAR) 5 MG tablet Take 1 tablet (5 mg total) by mouth daily. 12/22/15 12/21/16 Yes McKenzie, Candee Furbish, MD  PARoxetine (PAXIL-CR) 25 MG 24 hr tablet Take 1 tablet (25 mg total) by mouth daily. 03/15/16  Yes Glean Hess, MD  silodosin (RAPAFLO) 8 MG CAPS capsule Take 1 capsule (8 mg total) by mouth daily with breakfast. 05/13/16  Yes McKenzie, Candee Furbish, MD  VIAGRA 100 MG tablet TAKE 1 TABLET BY MOUTH EVERY DAY AS NEEDED 03/23/15  Yes Glean Hess, MD    Allergies  Allergen Reactions  . Bee Venom Anaphylaxis  . Wasp Venom Anaphylaxis    No past surgical history on file.  Social History  Substance Use Topics  . Smoking status: Never Smoker  . Smokeless tobacco: Former Systems developer    Types: Chew  . Alcohol use No     Medication list has been reviewed and updated.   Physical Exam  Constitutional: He is oriented to person, place, and time. He appears well-developed. No distress.  HENT:  Head: Normocephalic and atraumatic.  Right Ear: Tympanic membrane and ear canal normal.  Left Ear: Tympanic membrane and ear canal normal.  Nose: Right sinus exhibits maxillary sinus tenderness. Left sinus exhibits maxillary sinus tenderness.  Mouth/Throat: Posterior oropharyngeal erythema  present. No oropharyngeal exudate or posterior oropharyngeal edema.  Neck: Normal range of motion. Neck supple.  Cardiovascular: Normal rate, regular rhythm and normal heart sounds.   Pulmonary/Chest: Effort normal and breath sounds normal. No respiratory distress. He has no wheezes.  Musculoskeletal: Normal range of motion.  Lymphadenopathy:    He has no cervical adenopathy.    Neurological: He is alert and oriented to person, place, and time.  Skin: Skin is warm and dry. No rash noted.  Psychiatric: He has a normal mood and affect. His behavior is normal. Thought content normal.  Nursing note and vitals reviewed.   BP (!) 142/84   Pulse 69   Temp 98 F (36.7 C)   Ht 6' (1.829 m)   Wt 217 lb (98.4 kg)   SpO2 95%   BMI 29.43 kg/m   Assessment and Plan: 1. Acute non-recurrent sinusitis, unspecified location Start sudafed 30 mg bid for congestion Additional 5 days of Amoxil Add tessalon during the day for cough - continue Tussionex at night - amoxicillin (AMOXIL) 875 MG tablet; Take 1 tablet (875 mg total) by mouth 2 (two) times daily.  Dispense: 10 tablet; Refill: 0 - benzonatate (TESSALON) 200 MG capsule; Take 1 capsule (200 mg total) by mouth 3 (three) times daily as needed for cough.  Dispense: 30 capsule; Refill: 0   Meds ordered this encounter  Medications  . amoxicillin (AMOXIL) 875 MG tablet    Sig: Take 1 tablet (875 mg total) by mouth 2 (two) times daily.    Dispense:  10 tablet    Refill:  0  . benzonatate (TESSALON) 200 MG capsule    Sig: Take 1 capsule (200 mg total) by mouth 3 (three) times daily as needed for cough.    Dispense:  30 capsule    Refill:  0    Halina Maidens, MD [facility]  12/15/2016

## 2017-03-03 ENCOUNTER — Other Ambulatory Visit: Payer: Self-pay | Admitting: Internal Medicine

## 2017-03-03 DIAGNOSIS — F419 Anxiety disorder, unspecified: Secondary | ICD-10-CM

## 2017-03-16 ENCOUNTER — Encounter: Payer: Self-pay | Admitting: Internal Medicine

## 2017-03-16 ENCOUNTER — Ambulatory Visit (INDEPENDENT_AMBULATORY_CARE_PROVIDER_SITE_OTHER): Payer: BLUE CROSS/BLUE SHIELD | Admitting: Internal Medicine

## 2017-03-16 VITALS — BP 122/80 | HR 71 | Ht 72.0 in | Wt 218.0 lb

## 2017-03-16 DIAGNOSIS — R338 Other retention of urine: Secondary | ICD-10-CM

## 2017-03-16 DIAGNOSIS — E782 Mixed hyperlipidemia: Secondary | ICD-10-CM | POA: Diagnosis not present

## 2017-03-16 DIAGNOSIS — E291 Testicular hypofunction: Secondary | ICD-10-CM

## 2017-03-16 DIAGNOSIS — F419 Anxiety disorder, unspecified: Secondary | ICD-10-CM

## 2017-03-16 DIAGNOSIS — Z1159 Encounter for screening for other viral diseases: Secondary | ICD-10-CM | POA: Diagnosis not present

## 2017-03-16 DIAGNOSIS — N401 Enlarged prostate with lower urinary tract symptoms: Secondary | ICD-10-CM

## 2017-03-16 DIAGNOSIS — Z0000 Encounter for general adult medical examination without abnormal findings: Secondary | ICD-10-CM | POA: Diagnosis not present

## 2017-03-16 LAB — POCT URINALYSIS DIPSTICK
BILIRUBIN UA: NEGATIVE
GLUCOSE UA: NEGATIVE
KETONES UA: NEGATIVE
LEUKOCYTES UA: NEGATIVE
NITRITE UA: NEGATIVE
PH UA: 6 (ref 5.0–8.0)
Spec Grav, UA: 1.015 (ref 1.010–1.025)
Urobilinogen, UA: 0.2 E.U./dL

## 2017-03-16 MED ORDER — ATORVASTATIN CALCIUM 10 MG PO TABS: 10.0000 mg | ORAL_TABLET | Freq: Every day | ORAL | 3 refills | Status: DC

## 2017-03-16 NOTE — Patient Instructions (Signed)
Health Maintenance  Topic Date Due  . Hepatitis C Screening  Completed today  . TETANUS/TDAP  02/02/1981  . INFLUENZA VACCINE  03/28/2018 (Originally 12/28/2016)  . HIV Screening  03/16/2022 (Originally 02/02/1977)  . Fecal DNA (Cologuard)  04/28/2019

## 2017-03-16 NOTE — Progress Notes (Signed)
Date:  03/16/2017   Name:  Charles Turner   DOB:  10-28-1960   MRN:  [id number]   Chief Complaint: Annual Exam; Hyperlipidemia; and Anxiety Charles Turner is a 56 y.o. male who presents today for his Complete Annual Exam. He feels fairly well. He reports exercising some. He reports he is sleeping fairly well.   Low Libido - patient reports some mild decrease in libido which is unchanged. He was on testosterone supplements in the past and is interested in testosterone level. We discussed issues with supplementation in view of prostate enlargement and possible focus of prostate cancer.  Urinary retention - he has not been seen by urology in about 1 year. At that time his post void residual was 800 cc. He was not taking Rapaflo or tamsulosin at that time. Now he is taking only Rapaflo but does not see much benefit. He is reluctant to return to urology but we discussed possible long-term repercussions of over distended bladder.  He describes several episodes of lightheadedness after strenuous leg press exercises at the gym. The episode lasted only 10 seconds and resolves quickly. He also noted similar symptoms working in the yard bending over and standing up begin up sticks. No headache associated with this, no chest pains, no loss of consciousness.   Hyperlipidemia  This is a chronic problem. The problem is controlled. Pertinent negatives include no chest pain or shortness of breath. Current antihyperlipidemic treatment includes statins. The current treatment provides significant improvement of lipids.  Anxiety  Presents for follow-up visit. Symptoms include nervous/anxious behavior. Patient reports no chest pain, decreased concentration, dizziness, palpitations or shortness of breath. Symptoms occur rarely. The most recent episode lasted 10 minutes. The severity of symptoms is mild. The quality of sleep is good.     Review of Systems  Constitutional: Negative for chills, fatigue, fever  and unexpected weight change.  HENT: Negative for hearing loss.   Respiratory: Negative for cough, chest tightness and shortness of breath.   Cardiovascular: Negative for chest pain, palpitations and leg swelling.  Gastrointestinal: Negative for abdominal pain.  Genitourinary: Positive for difficulty urinating, frequency and urgency.  Musculoskeletal: Negative for arthralgias, gait problem and joint swelling.  Skin: Negative for color change and rash.  Allergic/Immunologic: Negative for environmental allergies.  Neurological: Positive for light-headedness. Negative for dizziness, tremors and headaches.  Hematological: Negative for adenopathy.  Psychiatric/Behavioral: Negative for decreased concentration, dysphoric mood and sleep disturbance. The patient is nervous/anxious.     Patient Active Problem List   Diagnosis Date Noted  . Primary osteoarthritis of both knees 03/15/2016  . Nocturia 12/22/2015  . Urinary retention due to benign prostatic hyperplasia 12/22/2015  . Chronic anxiety 06/17/2015  . Bee sting allergy 06/17/2015  . Familial multiple lipoprotein-type hyperlipidemia 05/05/2015  . Benign enlargement of prostate 05/05/2015  . Back ache 05/05/2015  . Bladder neoplasm 05/05/2015  . Abnormal LFTs 05/05/2015  . Failure of erection 05/05/2015  . Blood in the urine 05/05/2015  . 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 Authorizing Provider  atorvastatin (LIPITOR) 10 MG tablet TAKE 1 TABLET (10 MG TOTAL) BY MOUTH DAILY. 12/15/16  Yes Glean Hess, MD  PARoxetine (PAXIL-CR) 25 MG 24 hr tablet TAKE 1 TABLET (25 MG TOTAL) BY MOUTH DAILY. 03/03/17  Yes Glean Hess, MD  silodosin (RAPAFLO) 8 MG CAPS capsule Take 1 capsule (8 mg total) by mouth daily with breakfast. 05/13/16  Yes McKenzie, Candee Furbish, MD  VIAGRA 100 MG tablet TAKE 1 TABLET BY MOUTH EVERY DAY AS NEEDED 03/23/15  Yes Glean Hess, MD     Allergies  Allergen Reactions  . Bee Venom Anaphylaxis  . Wasp Venom Anaphylaxis    History reviewed. No pertinent surgical history.  Social History  Substance Use Topics  . Smoking status: Never Smoker  . Smokeless tobacco: Former Systems developer    Types: Chew  . Alcohol use No     Medication list has been reviewed and updated.  PHQ 2/9 Scores 03/16/2017 03/15/2016 06/17/2015  PHQ - 2 Score 0 0 0    Physical Exam  Constitutional: He is oriented to person, place, and time. He appears well-developed and well-nourished. No distress.  HENT:  Head: Normocephalic and atraumatic.  Right Ear: Tympanic membrane, external ear and ear canal normal.  Left Ear: Tympanic membrane, external ear and ear canal normal.  Nose: Nose normal.  Mouth/Throat: Uvula is midline and oropharynx is clear and moist.  Eyes: Pupils are equal, round, and reactive to light. Conjunctivae and EOM are normal.  Neck: Normal range of motion. Neck supple. Carotid bruit is not present. No thyromegaly present.  Cardiovascular: Normal rate, regular rhythm, normal heart sounds and intact distal pulses.   syst BP sitting 122 and standing 118  Pulmonary/Chest: Effort normal and breath sounds normal. No respiratory distress. He has no wheezes. Right breast exhibits no mass. Left breast exhibits no mass.  Abdominal: Soft. Normal appearance and bowel sounds are normal. There is no hepatosplenomegaly. There is no tenderness.  Genitourinary:  Genitourinary Comments: DRE deferred  Musculoskeletal: Normal range of motion.  Lymphadenopathy:    He has no cervical adenopathy.  Neurological: He is alert and oriented to person, place, and time. He has normal reflexes.  Skin: Skin is warm, dry and intact. No rash noted.  Psychiatric: He has a normal mood and affect. His speech is normal and behavior is normal. Judgment and thought content normal.  Nursing note and vitals reviewed.   BP 122/80   Pulse 71   Ht 6' (1.829 m)   Wt  218 lb (98.9 kg)   SpO2 97%   BMI 29.57 kg/m   Assessment and Plan: 1. Annual physical exam Normal exam - CBC with Differential/Platelet - POCT urinalysis dipstick  2. Mixed hyperlipidemia Continue lipitor - atorvastatin (LIPITOR) 10 MG tablet; Take 1 tablet (10 mg total) by mouth daily.  Dispense: 90 tablet; Refill: 3 - Comprehensive metabolic panel - Lipid panel  3. Chronic anxiety Doing well on Paxil - TSH  4. Eunuchoidism Will check labs - would not supplement unless very low and PSA is normal - Testosterone  5. Urinary retention due to benign prostatic hyperplasia Pt plans to stop Rapaflo when current supply is out I strongly encouraged him to follow up with Urology UA positive for blood (unchanged) and protein (probably from chronic distention) - PSA  6. Need for hepatitis C screening test - Hepatitis C antibody   Meds ordered this encounter  Medications  . atorvastatin (LIPITOR) 10 MG tablet    Sig: Take 1 tablet (10 mg total) by mouth daily.    Dispense:  90 tablet    Refill:  3    Partially dictated using Editor, commissioning. Any errors are unintentional.  Halina Maidens, MD [facility]  03/16/2017

## 2017-03-17 LAB — LIPID PANEL
CHOLESTEROL TOTAL: 193 mg/dL (ref 100–199)
Chol/HDL Ratio: 3.1 ratio (ref 0.0–5.0)
HDL: 63 mg/dL (ref >39)
LDL Calculated: 102 mg/dL — ABNORMAL HIGH (ref 0–99)
Triglycerides: 138 mg/dL (ref 0–149)
VLDL Cholesterol Cal: 28 mg/dL (ref 5–40)

## 2017-03-17 LAB — CBC WITH DIFFERENTIAL/PLATELET
BASOS ABS: 0 10*3/uL (ref 0.0–0.2)
Basos: 0 %
EOS (ABSOLUTE): 0.1 10*3/uL (ref 0.0–0.4)
Eos: 1 %
HEMOGLOBIN: 14.5 g/dL (ref 13.0–17.7)
Hematocrit: 44.2 % (ref 37.5–51.0)
Immature Grans (Abs): 0 10*3/uL (ref 0.0–0.1)
Immature Granulocytes: 0 %
LYMPHS ABS: 2.8 10*3/uL (ref 0.7–3.1)
Lymphs: 42 %
MCH: 29.7 pg (ref 26.6–33.0)
MCHC: 32.8 g/dL (ref 31.5–35.7)
MCV: 91 fL (ref 79–97)
MONOCYTES: 7 %
MONOS ABS: 0.5 10*3/uL (ref 0.1–0.9)
NEUTROS ABS: 3.2 10*3/uL (ref 1.4–7.0)
Neutrophils: 50 %
Platelets: 287 10*3/uL (ref 150–379)
RBC: 4.88 x10E6/uL (ref 4.14–5.80)
RDW: 14 % (ref 12.3–15.4)
WBC: 6.5 10*3/uL (ref 3.4–10.8)

## 2017-03-17 LAB — COMPREHENSIVE METABOLIC PANEL
ALBUMIN: 4.7 g/dL (ref 3.5–5.5)
ALK PHOS: 54 IU/L (ref 39–117)
ALT: 22 IU/L (ref 0–44)
AST: 21 IU/L (ref 0–40)
Albumin/Globulin Ratio: 2 (ref 1.2–2.2)
BILIRUBIN TOTAL: 0.5 mg/dL (ref 0.0–1.2)
BUN / CREAT RATIO: 17 (ref 9–20)
BUN: 18 mg/dL (ref 6–24)
CHLORIDE: 100 mmol/L (ref 96–106)
CO2: 24 mmol/L (ref 20–29)
Calcium: 9.8 mg/dL (ref 8.7–10.2)
Creatinine, Ser: 1.08 mg/dL (ref 0.76–1.27)
GFR calc Af Amer: 89 mL/min/{1.73_m2} (ref >59)
GFR calc non Af Amer: 77 mL/min/{1.73_m2} (ref >59)
GLOBULIN, TOTAL: 2.3 g/dL (ref 1.5–4.5)
GLUCOSE: 100 mg/dL — AB (ref 65–99)
Potassium: 4.5 mmol/L (ref 3.5–5.2)
SODIUM: 140 mmol/L (ref 134–144)
Total Protein: 7 g/dL (ref 6.0–8.5)

## 2017-03-17 LAB — TSH: TSH: 3.35 u[IU]/mL (ref 0.450–4.500)

## 2017-03-17 LAB — HEPATITIS C ANTIBODY

## 2017-03-17 LAB — TESTOSTERONE: TESTOSTERONE: 326 ng/dL (ref 264–916)

## 2017-03-17 LAB — PSA: PROSTATE SPECIFIC AG, SERUM: 2.7 ng/mL (ref 0.0–4.0)

## 2017-03-26 ENCOUNTER — Other Ambulatory Visit: Payer: Self-pay | Admitting: Internal Medicine

## 2017-08-23 ENCOUNTER — Other Ambulatory Visit: Payer: Self-pay | Admitting: Internal Medicine

## 2017-08-23 DIAGNOSIS — F419 Anxiety disorder, unspecified: Secondary | ICD-10-CM

## 2018-02-17 ENCOUNTER — Other Ambulatory Visit: Payer: Self-pay | Admitting: Internal Medicine

## 2018-02-17 DIAGNOSIS — F419 Anxiety disorder, unspecified: Secondary | ICD-10-CM

## 2018-03-18 ENCOUNTER — Other Ambulatory Visit: Payer: Self-pay | Admitting: Internal Medicine

## 2018-03-19 ENCOUNTER — Ambulatory Visit (INDEPENDENT_AMBULATORY_CARE_PROVIDER_SITE_OTHER): Payer: BLUE CROSS/BLUE SHIELD | Admitting: Internal Medicine

## 2018-03-19 ENCOUNTER — Encounter: Payer: Self-pay | Admitting: Internal Medicine

## 2018-03-19 VITALS — BP 112/70 | HR 67 | Ht 72.0 in | Wt 203.8 lb

## 2018-03-19 DIAGNOSIS — Z91030 Bee allergy status: Secondary | ICD-10-CM

## 2018-03-19 DIAGNOSIS — Z0000 Encounter for general adult medical examination without abnormal findings: Secondary | ICD-10-CM

## 2018-03-19 DIAGNOSIS — F419 Anxiety disorder, unspecified: Secondary | ICD-10-CM

## 2018-03-19 DIAGNOSIS — N401 Enlarged prostate with lower urinary tract symptoms: Secondary | ICD-10-CM

## 2018-03-19 DIAGNOSIS — E782 Mixed hyperlipidemia: Secondary | ICD-10-CM | POA: Diagnosis not present

## 2018-03-19 DIAGNOSIS — E291 Testicular hypofunction: Secondary | ICD-10-CM | POA: Diagnosis not present

## 2018-03-19 DIAGNOSIS — R338 Other retention of urine: Secondary | ICD-10-CM

## 2018-03-19 DIAGNOSIS — Z125 Encounter for screening for malignant neoplasm of prostate: Secondary | ICD-10-CM | POA: Diagnosis not present

## 2018-03-19 LAB — POCT URINALYSIS DIPSTICK
Bilirubin, UA: NEGATIVE
Glucose, UA: NEGATIVE
Ketones, UA: NEGATIVE
Leukocytes, UA: NEGATIVE
NITRITE UA: NEGATIVE
PH UA: 6 (ref 5.0–8.0)
PROTEIN UA: POSITIVE — AB
Spec Grav, UA: 1.015 (ref 1.010–1.025)
UROBILINOGEN UA: 0.2 U/dL

## 2018-03-19 MED ORDER — EPINEPHRINE 0.3 MG/0.3ML IJ SOAJ: 0.3000 mg | Freq: Once | INTRAMUSCULAR | 3 refills | Status: DC

## 2018-03-19 MED ORDER — ATORVASTATIN CALCIUM 10 MG PO TABS: 10.0000 mg | ORAL_TABLET | Freq: Every day | ORAL | 3 refills | Status: DC

## 2018-03-19 NOTE — Patient Instructions (Signed)
Start taking Claritin 10 mg or Allegra 180 mg once a day to reduce itching on arms  Schedule annual Dermatology skin survey

## 2018-03-19 NOTE — Progress Notes (Signed)
Date:  03/19/2018   Name:  Charles Turner   DOB:  1960/10/07   MRN:  [id number]   Chief Complaint: Annual Exam (Patient declined flu shot. ) Charles Turner is a 57 y.o. male who presents today for his Complete Annual Exam. He feels fairly well. He reports exercising three times a week at the gym. He reports he is sleeping well.   Hyperlipidemia  This is a chronic problem. Pertinent negatives include no chest pain, myalgias or shortness of breath. Current antihyperlipidemic treatment includes statins. The current treatment provides significant improvement of lipids.  Anxiety  Presents for follow-up visit. Patient reports no chest pain, dizziness, nervous/anxious behavior, palpitations or shortness of breath. Symptoms occur rarely. The quality of sleep is good.   Compliance with medications is 76-100%.  Benign Prostatic Hypertrophy  This is a chronic problem. The problem is unchanged. Irritative symptoms do not include frequency or urgency. Obstructive symptoms include incomplete emptying and straining. Pertinent negatives include no chills or dysuria. Past treatments include finasteride and tamsulosin. The treatment provided no relief. Duration of treatment: stopped all medication.  Bee sting allergy - he has managed to avoid any further stings.  His Epipen has expired.  Review of Systems  Constitutional: Positive for diaphoresis (sweats at night). Negative for appetite change, chills, fatigue and unexpected weight change.  HENT: Negative for hearing loss, tinnitus, trouble swallowing and voice change.   Eyes: Negative for visual disturbance.  Respiratory: Negative for choking, shortness of breath and wheezing.   Cardiovascular: Negative for chest pain, palpitations and leg swelling.  Gastrointestinal: Negative for abdominal pain, blood in stool, constipation and diarrhea.  Endocrine: Positive for cold intolerance (cold feet).  Genitourinary: Positive for difficulty urinating and  incomplete emptying. Negative for dysuria, frequency and urgency.  Musculoskeletal: Negative for arthralgias, back pain and myalgias.  Skin: Negative for color change and rash.       Itching on arms - daily with no rash  Allergic/Immunologic: Negative for environmental allergies.  Neurological: Negative for dizziness, syncope and headaches.  Hematological: Negative for adenopathy.  Psychiatric/Behavioral: Negative for dysphoric mood and sleep disturbance. The patient is not nervous/anxious.     Patient Active Problem List   Diagnosis Date Noted  . Primary osteoarthritis of both knees 03/15/2016  . Urinary retention due to benign prostatic hyperplasia 12/22/2015  . Chronic anxiety 06/17/2015  . Bee sting allergy 06/17/2015  . Familial multiple lipoprotein-type hyperlipidemia 05/05/2015  . Bladder neoplasm 05/05/2015  . Abnormal LFTs 05/05/2015  . Failure of erection 05/05/2015  . Eunuchoidism 05/05/2015    Allergies  Allergen Reactions  . Bee Venom Anaphylaxis  . Wasp Venom Anaphylaxis    History reviewed. No pertinent surgical history.  Social History   Tobacco Use  . Smoking status: Never Smoker  . Smokeless tobacco: Former Systems developer    Types: Chew  Substance Use Topics  . Alcohol use: No    Alcohol/week: 0.0 standard drinks  . Drug use: No     Medication list has been reviewed and updated.  Current Meds  Medication Sig  . atorvastatin (LIPITOR) 10 MG tablet Take 1 tablet (10 mg total) by mouth daily.  Marland Kitchen PARoxetine (PAXIL-CR) 25 MG 24 hr tablet TAKE 1 TABLET (25 MG TOTAL) BY MOUTH DAILY.  Marland Kitchen VIAGRA 100 MG tablet TAKE 1 TABLET BY MOUTH EVERY DAY AS NEEDED    PHQ 2/9 Scores 03/19/2018 03/16/2017 03/15/2016 06/17/2015  PHQ - 2 Score 0 0 0 0    Physical  Exam  Constitutional: He is oriented to person, place, and time. He appears well-developed and well-nourished.  HENT:  Head: Normocephalic.  Right Ear: Tympanic membrane, external ear and ear canal normal.  Left  Ear: Tympanic membrane, external ear and ear canal normal.  Nose: Nose normal.  Mouth/Throat: Uvula is midline and oropharynx is clear and moist.  Eyes: Pupils are equal, round, and reactive to light. Conjunctivae and EOM are normal.  Neck: Normal range of motion. Neck supple. Carotid bruit is not present. No thyromegaly present.  Cardiovascular: Normal rate, regular rhythm, normal heart sounds and intact distal pulses.  Pulses:      Carotid pulses are 2+ on the right side, and 2+ on the left side.      Radial pulses are 2+ on the right side, and 2+ on the left side.       Dorsalis pedis pulses are 2+ on the right side, and 2+ on the left side.       Posterior tibial pulses are 2+ on the right side, and 2+ on the left side.  Pulmonary/Chest: Effort normal and breath sounds normal. He has no wheezes. Right breast exhibits no mass. Left breast exhibits no mass.  Abdominal: Soft. Normal appearance and bowel sounds are normal. There is no hepatosplenomegaly. There is no tenderness.  Musculoskeletal: Normal range of motion.  Lymphadenopathy:    He has no cervical adenopathy.  Neurological: He is alert and oriented to person, place, and time. He has normal reflexes.  Skin: Skin is warm, dry and intact. No rash noted.  Psychiatric: He has a normal mood and affect. His speech is normal and behavior is normal. Judgment and thought content normal.  Nursing note and vitals reviewed.   BP 112/70 (BP Location: Right Arm, Patient Position: Sitting, Cuff Size: Large)   Pulse 67   Ht 6' (1.829 m)   Wt 203 lb 12.8 oz (92.4 kg)   SpO2 98%   BMI 27.64 kg/m   Assessment and Plan: 1. Annual physical exam Continue exercise and health diet Try non sedating antihistamine for itching - CBC with Differential/Platelet  2. Prostate cancer screening - PSA  3. Mixed hyperlipidemia Continue medication - Comprehensive metabolic panel - Lipid panel - atorvastatin (LIPITOR) 10 MG tablet; Take 1 tablet (10  mg total) by mouth daily.  Dispense: 90 tablet; Refill: 3  4. Chronic anxiety Doing well on Paxil  5. Urinary retention due to benign prostatic hyperplasia With hematuria Refer to Urology - POCT urinalysis dipstick - Ambulatory referral to Urology  6. Eunuchoidism Check levels - may be the cause of night sweats Any supplementation would need to be supervised by Urology - Testosterone  7. Bee sting allergy - EPINEPHrine 0.3 mg/0.3 mL IJ SOAJ injection; Inject 0.3 mLs (0.3 mg total) into the muscle once for 1 dose.  Dispense: 2 Device; Refill: 3   Partially dictated using Editor, commissioning. Any errors are unintentional.  Halina Maidens, MD [facility]  03/19/2018

## 2018-03-20 LAB — CBC WITH DIFFERENTIAL/PLATELET
Basophils Absolute: 0 10*3/uL (ref 0.0–0.2)
Basos: 0 %
EOS (ABSOLUTE): 0.1 10*3/uL (ref 0.0–0.4)
EOS: 2 %
HEMOGLOBIN: 14.7 g/dL (ref 13.0–17.7)
Hematocrit: 45.1 % (ref 37.5–51.0)
IMMATURE GRANULOCYTES: 0 %
Immature Grans (Abs): 0 10*3/uL (ref 0.0–0.1)
LYMPHS: 41 %
Lymphocytes Absolute: 2.9 10*3/uL (ref 0.7–3.1)
MCH: 30 pg (ref 26.6–33.0)
MCHC: 32.6 g/dL (ref 31.5–35.7)
MCV: 92 fL (ref 79–97)
MONOCYTES: 8 %
Monocytes Absolute: 0.5 10*3/uL (ref 0.1–0.9)
NEUTROS PCT: 49 %
Neutrophils Absolute: 3.4 10*3/uL (ref 1.4–7.0)
Platelets: 306 10*3/uL (ref 150–450)
RBC: 4.9 x10E6/uL (ref 4.14–5.80)
RDW: 13.1 % (ref 12.3–15.4)
WBC: 7 10*3/uL (ref 3.4–10.8)

## 2018-03-20 LAB — COMPREHENSIVE METABOLIC PANEL
ALBUMIN: 4.5 g/dL (ref 3.5–5.5)
ALT: 21 IU/L (ref 0–44)
AST: 21 IU/L (ref 0–40)
Albumin/Globulin Ratio: 2 (ref 1.2–2.2)
Alkaline Phosphatase: 53 IU/L (ref 39–117)
BUN/Creatinine Ratio: 16 (ref 9–20)
BUN: 17 mg/dL (ref 6–24)
Bilirubin Total: 0.4 mg/dL (ref 0.0–1.2)
CALCIUM: 9.8 mg/dL (ref 8.7–10.2)
CO2: 26 mmol/L (ref 20–29)
CREATININE: 1.09 mg/dL (ref 0.76–1.27)
Chloride: 100 mmol/L (ref 96–106)
GFR calc Af Amer: 87 mL/min/{1.73_m2} (ref >59)
GFR, EST NON AFRICAN AMERICAN: 75 mL/min/{1.73_m2} (ref >59)
Globulin, Total: 2.3 g/dL (ref 1.5–4.5)
Glucose: 94 mg/dL (ref 65–99)
Potassium: 4.4 mmol/L (ref 3.5–5.2)
SODIUM: 140 mmol/L (ref 134–144)
Total Protein: 6.8 g/dL (ref 6.0–8.5)

## 2018-03-20 LAB — TESTOSTERONE: Testosterone: 282 ng/dL (ref 264–916)

## 2018-03-20 LAB — LIPID PANEL
CHOL/HDL RATIO: 2.7 ratio (ref 0.0–5.0)
Cholesterol, Total: 205 mg/dL — ABNORMAL HIGH (ref 100–199)
HDL: 77 mg/dL (ref >39)
LDL CALC: 100 mg/dL — AB (ref 0–99)
TRIGLYCERIDES: 138 mg/dL (ref 0–149)
VLDL CHOLESTEROL CAL: 28 mg/dL (ref 5–40)

## 2018-03-20 LAB — PSA: Prostate Specific Ag, Serum: 3.1 ng/mL (ref 0.0–4.0)

## 2018-03-26 ENCOUNTER — Ambulatory Visit: Payer: BLUE CROSS/BLUE SHIELD | Admitting: Urology

## 2018-04-13 ENCOUNTER — Other Ambulatory Visit
Admission: RE | Admit: 2018-04-13 | Discharge: 2018-04-13 | Disposition: A | Discharge status: Home / Self Care | Payer: BLUE CROSS/BLUE SHIELD | Source: Ambulatory Visit | Attending: Urology | Admitting: Urology

## 2018-04-13 ENCOUNTER — Other Ambulatory Visit: Payer: Self-pay

## 2018-04-13 ENCOUNTER — Ambulatory Visit (INDEPENDENT_AMBULATORY_CARE_PROVIDER_SITE_OTHER): Payer: BLUE CROSS/BLUE SHIELD | Admitting: Urology

## 2018-04-13 ENCOUNTER — Encounter: Payer: Self-pay | Admitting: Urology

## 2018-04-13 ENCOUNTER — Encounter (INDEPENDENT_AMBULATORY_CARE_PROVIDER_SITE_OTHER): Payer: Self-pay

## 2018-04-13 VITALS — BP 132/84 | HR 68 | Ht 72.0 in | Wt 210.0 lb

## 2018-04-13 DIAGNOSIS — N323 Diverticulum of bladder: Secondary | ICD-10-CM | POA: Diagnosis not present

## 2018-04-13 DIAGNOSIS — R339 Retention of urine, unspecified: Secondary | ICD-10-CM | POA: Diagnosis not present

## 2018-04-13 DIAGNOSIS — E291 Testicular hypofunction: Secondary | ICD-10-CM

## 2018-04-13 DIAGNOSIS — N401 Enlarged prostate with lower urinary tract symptoms: Secondary | ICD-10-CM | POA: Insufficient documentation

## 2018-04-13 DIAGNOSIS — R3129 Other microscopic hematuria: Secondary | ICD-10-CM | POA: Diagnosis not present

## 2018-04-13 DIAGNOSIS — N138 Other obstructive and reflux uropathy: Secondary | ICD-10-CM

## 2018-04-13 LAB — URINALYSIS, COMPLETE (UACMP) WITH MICROSCOPIC
Bacteria, UA: NONE SEEN
Bilirubin Urine: NEGATIVE
GLUCOSE, UA: NEGATIVE mg/dL
Ketones, ur: NEGATIVE mg/dL
Nitrite: NEGATIVE
PH: 6.5 (ref 5.0–8.0)
PROTEIN: 100 mg/dL — AB
SPECIFIC GRAVITY, URINE: 1.02 (ref 1.005–1.030)
Squamous Epithelial / LPF: NONE SEEN (ref 0–5)

## 2018-04-13 LAB — BLADDER SCAN AMB NON-IMAGING

## 2018-04-13 MED ORDER — DIAZEPAM 10 MG PO TABS: 10.0000 mg | ORAL_TABLET | Freq: Once | ORAL | 0 refills | Status: DC

## 2018-04-13 MED ORDER — DIAZEPAM 10 MG PO TABS: 10.0000 mg | ORAL_TABLET | Freq: Once | ORAL | 0 refills | Status: AC

## 2018-04-13 NOTE — Progress Notes (Signed)
04/13/2018 2:26 PM   Charles Turner 03-23-1961 [id number]  Referring provider: Glean Hess, MD [address] [place], [place] [address]  Chief Complaint  Patient presents with  . Benign Prostatic Hypertrophy    New Patient    HPI: 57 year old male with chronic urinary retention, BPH with bladder outlet obstruction, large bladder diverticulum who returns today to the office after not following up with urology for over 2 years.  Patient was followed by urology in the past for hematuria status post work-up including CT urogram and cystoscopy in 2017 by Dr. Elnoria Howard and was later seen by Nicolette Bang with discussion of possible urodynamics followed by an outlet procedure.  The patient did not wish to perfuse to urodynamics and never followed up with urology.  That time, he was having postvoid residuals greater than 750 cc by maximal pharmacotherapy with Rapaflo 8 mg and finasteride 5 mg.   He has since stopped all medication as it was not helping and has had no change in his symptoms.   In the interim, he has had no provement in his urinary symptoms.  He still has severe symptoms including both irritative and obstructive.  IPSS as below.    His visit was prompted by concern for chronic retention by his primary care and appropriate guidance by Dr. Army Melia to return for reassessment.  Cr 1.09 as of 02/2018.  Additionally, in the interim he is been having night sweats and his testosterone was borderline low at 282 on last draw on 03/19/2018.  The previous year was within normal limits.  This was drawn at 9:43 AM.   [place] Name 04/13/18 1200         International Prostate Symptom Score   How often have you had the sensation of not emptying your bladder?  Almost always     How often have you had to urinate less than every two hours?  More than half the time     How often have you found you stopped and started again several times when you urinated?  Almost always      How often have you found it difficult to postpone urination?  Less than half the time     How often have you had a weak urinary stream?  Almost always     How often have you had to strain to start urination?  Almost always     How many times did you typically get up at night to urinate?  1 Time     Total IPSS Score  27       Quality of Life due to urinary symptoms   If you were to spend the rest of your life with your urinary condition just the way it is now how would you feel about that?  Mostly Satisfied        Score:  1-7 Mild 8-19 Moderate 20-35 Severe    Androgen Deficiency in the Aging Male    [place] Name 04/13/18 1200         Androgen Deficiency in the Aging Male   Do you have a decrease in libido (sex drive)  Yes     Do you have lack of energy  No     Do you have a decrease in strength and/or endurance  No     Have you lost height  No     Have you noticed a decreased "enjoyment of life"  No  Are you sad and/or grumpy  No     Are your erections less strong  Yes     Have you noticed a recent deterioration in your ability to play sports  No     Are you falling asleep after dinner  No     Has there been a recent deterioration in your work performance  No          PMH: Past Medical History:  Diagnosis Date  . Anxiety   . High cholesterol   . Prostate enlargement     Surgical History: No past surgical history on file.  Home Medications:  Allergies as of 04/13/2018      Reactions   Bee Venom Anaphylaxis   Wasp Venom Anaphylaxis      Medication List        Accurate as of 04/13/18 11:59 PM. Always use your most recent med list.          atorvastatin 10 MG tablet Commonly known as:  LIPITOR Take 1 tablet (10 mg total) by mouth daily.   diazepam 10 MG tablet Commonly known as:  VALIUM Take 1 tablet (10 mg total) by mouth once for 1 dose. Take 1 hour prior to procedure. Please have a driver available.   PARoxetine 25 MG 24 hr tablet Commonly  known as:  PAXIL-CR TAKE 1 TABLET (25 MG TOTAL) BY MOUTH DAILY.   VIAGRA 100 MG tablet Generic drug:  sildenafil TAKE 1 TABLET BY MOUTH EVERY DAY AS NEEDED       Allergies:  Allergies  Allergen Reactions  . Bee Venom Anaphylaxis  . Wasp Venom Anaphylaxis    Family History: Family History  Adopted: Yes  Family history unknown: Yes    Social History:  reports that he has never smoked. He has quit using smokeless tobacco.  His smokeless tobacco use included chew. He reports that he does not drink alcohol or use drugs.  ROS: 12 point review of systems negative other than as per HPI above.  Physical Exam: BP 132/84   Pulse 68   Ht 6' (1.829 m)   Wt 210 lb (95.3 kg)   BMI 28.48 kg/m   Constitutional:  Alert and oriented, No acute distress. HEENT: [place] AT, moist mucus membranes.  Trachea midline, no masses. Cardiovascular: No clubbing, cyanosis, or edema. Respiratory: Normal respiratory effort, no increased work of breathing. GI: Abdomen is soft, nontender, nondistended, no abdominal masses GU: Bladder is palpable. Skin: No rashes, bruises or suspicious lesions. Neurologic: Grossly intact, no focal deficits, moving all 4 extremities. Psychiatric: Normal mood and affect.  Laboratory Data: Lab Results  Component Value Date   WBC 7.0 03/19/2018   HGB 14.7 03/19/2018   HCT 45.1 03/19/2018   MCV 92 03/19/2018   PLT 306 03/19/2018    Lab Results  Component Value Date   CREATININE 1.09 03/19/2018    Lab Results  Component Value Date   TESTOSTERONE 282 03/19/2018   Component     Latest Ref Rng & Units 03/16/2017 03/19/2018  Prostate Specific Ag, Serum     0.0 - 4.0 ng/mL 2.7 3.1     Urinalysis Results for orders placed or performed during the hospital encounter of 04/13/18  Urinalysis, Complete w Microscopic  Result Value Ref Range   Color, Urine YELLOW YELLOW   APPearance HAZY (A) CLEAR   Specific Gravity, Urine 1.020 1.005 - 1.030   pH 6.5 5.0 - 8.0    Glucose, UA NEGATIVE NEGATIVE mg/dL  Hgb urine dipstick LARGE (A) NEGATIVE   Bilirubin Urine NEGATIVE NEGATIVE   Ketones, ur NEGATIVE NEGATIVE mg/dL   Protein, ur 100 (A) NEGATIVE mg/dL   Nitrite NEGATIVE NEGATIVE   Leukocytes, UA TRACE (A) NEGATIVE   Squamous Epithelial / LPF NONE SEEN 0 - 5   WBC, UA 0-5 0 - 5 WBC/hpf   RBC / HPF >50 0 - 5 RBC/hpf   Bacteria, UA NONE SEEN NONE SEEN     Pertinent Imaging: PVR greater than 100 cc   Assessment & Plan:    1. Benign prostatic hyperplasia with urinary obstruction Massive prostamegaly with chronic outlet obstruction resulting in massive bladder diverticulum  Failed maximal medical management We had a lengthy discussion today that at this point, concern for upper tract compromise as well as loss of compliance/detrusor function given the chronicity of his outlet obstruction and chronic retention---> this point in time, this is a surgical problem and the patient may benefit from diverticulectomy with some form of an outlet procedure including holep versus simple robotic prostatectomy to restore normal detrusor function and voiding pattern Prior to consideration of the sort of procedure, urodynamics is imperative as per previous recommendations from both Dr. Elnoria Howard and Dr. Alyson Ingles, we discussed the procedure at length today including risk and benefits as well as the purpose of the procedure He reports that in the past he was noncompliant with this and did not wish to pursue it as he did not fully understand how this could benefit him As on previous occasions today, he is refused Foley catheter or CIC teaching CT scan from 2015 personally reviewed today which shows sequela of chronic outlet obstruction as outlined above, massive prostamegaly without hydronephrosis  - BLADDER SCAN AMB NON-IMAGING - US RENAL; Future  2. Urinary retention As above  3. Microscopic hematuria Persistent microscopic hematuria today, recommend repeat cystoscopy in  the office as well as renal ultrasound to assess renal parenchyma, presence of possible hydronephrosis given chronic obstruction, and previous renal cyst  Patient is agreeable this plan and will pursue renal ultrasound and strongly consider office cystoscopy which was not well-tolerated in the past, he was offered to pursue cystoscopy either in the operating room or with oral Valium prior to the procedure (he understands he will need a ride on this day)  Is encouraged that even if he does not want to have cystoscopy on the day of the procedure, I would strongly recommend that he follow-up with me and continue seeing a urologist on a regular basis due to the severity of his problems  4. Acquired bladder diverticulum Large bladder diverticulum, likely pop off mechanism for previous high pressure bladder  5. Hypogonadism in male This is not discussed today given the severity and complexity of above issues Additionally, testosterone supplementation will likely exacerbate his prostate issues If he does wish to pursue this, would recommend repeating a.m. testosterone earlier in the morning   Return in about 4 weeks (around 05/11/2018) for cysto.  Hollice Espy, MD  [facility] [address] [place], [place] [address] [phone]   I spent 40 min with this patient of which greater than 50% was spent in counseling and coordination of care with the patient.

## 2018-04-13 NOTE — Patient Instructions (Signed)
9

## 2018-04-18 ENCOUNTER — Encounter (INDEPENDENT_AMBULATORY_CARE_PROVIDER_SITE_OTHER): Payer: Self-pay

## 2018-04-18 ENCOUNTER — Ambulatory Visit
Admission: RE | Admit: 2018-04-18 | Discharge: 2018-04-18 | Disposition: A | Discharge status: Home / Self Care | Payer: BLUE CROSS/BLUE SHIELD | Source: Ambulatory Visit | Attending: Urology | Admitting: Urology

## 2018-04-18 DIAGNOSIS — N323 Diverticulum of bladder: Secondary | ICD-10-CM | POA: Insufficient documentation

## 2018-04-18 DIAGNOSIS — N401 Enlarged prostate with lower urinary tract symptoms: Secondary | ICD-10-CM | POA: Insufficient documentation

## 2018-04-18 DIAGNOSIS — N138 Other obstructive and reflux uropathy: Secondary | ICD-10-CM

## 2018-04-19 ENCOUNTER — Telehealth: Payer: Self-pay

## 2018-04-19 NOTE — Telephone Encounter (Signed)
We can discuss more at his follow up.  Hollice Espy, MD

## 2018-04-19 NOTE — Telephone Encounter (Signed)
-----   Message from Hollice Espy, MD sent at 04/18/2018  7:05 PM EST ----- Your renal ultrasound looks abnormal but as expected.  Your kidneys look healthy and fairly normal without any swelling/hydronephrosis.  Your bladder it looks abnormal as we are already aware.  Your prostate is enlarged measuring approximately 90 g.  We will discuss this further at your follow-up visit.  Hollice Espy, MD

## 2018-04-19 NOTE — Telephone Encounter (Signed)
Called and spoke with pt, he would like to know more about the diverticulum if there was one or more present. Please advise.

## 2018-04-23 NOTE — Telephone Encounter (Signed)
Left message informing patient that they can discuss diverticulum at his next appointment.

## 2018-05-04 ENCOUNTER — Telehealth: Payer: Self-pay | Admitting: Urology

## 2018-05-04 NOTE — Telephone Encounter (Signed)
Patient called today around 4:30 and wanted to speak with someone in [place] regarding his upcoming app on 05-11-18. He stated that he did not want to proceed with the cysto. And that he had some questions for Dr. Erlene Quan or her staff. He also had questions regarding the cost of the follow up visit. I can speak with him about that but will not be able to quote him a price.  Sharyn Lull

## 2018-05-08 NOTE — Telephone Encounter (Signed)
Spoke to patient and he states he is not going to do the Cystoscopy, he does not wasn't to do Urodynamics. He was not wanting to come for a follow up if there will be nothing to discuss. He states his bill at last visit cost him over 200.00 and wants to eliminate the visit if you can just talk to him over the phone. Please advise

## 2018-05-08 NOTE — Telephone Encounter (Signed)
Patient notified and will come to appointment

## 2018-05-08 NOTE — Telephone Encounter (Signed)
This is an extremely complex patient and in order to provide the best care, he really does need to be seen in person.  Please encourage him to make a follow-up visit to discuss.  Unless that the simple straightforward question, an appointment needs to be scheduled.  Hollice Espy, MD

## 2018-05-10 ENCOUNTER — Other Ambulatory Visit: Payer: Self-pay

## 2018-05-10 DIAGNOSIS — N401 Enlarged prostate with lower urinary tract symptoms: Secondary | ICD-10-CM

## 2018-05-11 ENCOUNTER — Encounter: Payer: Self-pay | Admitting: Urology

## 2018-05-11 ENCOUNTER — Other Ambulatory Visit: Payer: Self-pay

## 2018-05-11 ENCOUNTER — Other Ambulatory Visit
Admission: RE | Admit: 2018-05-11 | Discharge: 2018-05-11 | Disposition: A | Discharge status: Home / Self Care | Payer: BLUE CROSS/BLUE SHIELD | Attending: Urology | Admitting: Urology

## 2018-05-11 ENCOUNTER — Ambulatory Visit (INDEPENDENT_AMBULATORY_CARE_PROVIDER_SITE_OTHER): Payer: BLUE CROSS/BLUE SHIELD | Admitting: Urology

## 2018-05-11 VITALS — BP 143/92 | HR 63 | Ht 72.0 in | Wt 210.0 lb

## 2018-05-11 DIAGNOSIS — N323 Diverticulum of bladder: Secondary | ICD-10-CM | POA: Diagnosis not present

## 2018-05-11 DIAGNOSIS — R3129 Other microscopic hematuria: Secondary | ICD-10-CM

## 2018-05-11 DIAGNOSIS — N401 Enlarged prostate with lower urinary tract symptoms: Secondary | ICD-10-CM | POA: Diagnosis not present

## 2018-05-11 DIAGNOSIS — N138 Other obstructive and reflux uropathy: Secondary | ICD-10-CM

## 2018-05-11 LAB — URINALYSIS, COMPLETE (UACMP) WITH MICROSCOPIC
BILIRUBIN URINE: NEGATIVE
Glucose, UA: NEGATIVE mg/dL
Ketones, ur: NEGATIVE mg/dL
Nitrite: NEGATIVE
PROTEIN: 100 mg/dL — AB
Specific Gravity, Urine: 1.02 (ref 1.005–1.030)
pH: 7 (ref 5.0–8.0)

## 2018-05-11 MED ORDER — FINASTERIDE 5 MG PO TABS: 5.0000 mg | ORAL_TABLET | Freq: Every day | ORAL | 3 refills | Status: DC

## 2018-05-11 MED ORDER — TAMSULOSIN HCL 0.4 MG PO CAPS: 0.4000 mg | ORAL_CAPSULE | Freq: Every day | ORAL | 3 refills | Status: DC

## 2018-05-11 NOTE — Progress Notes (Signed)
05/11/2018 12:42 PM   Charles Turner 1960-10-06 [id number]  Referring provider: Glean Hess, MD [address] [place], [place] [address]  Chief Complaint  Patient presents with  . Benign Prostatic Hypertrophy    HPI: 57 year old male with severe bladder outlet obstruction, chronic urinary retention secondary to BPH and a large bladder diverticulum who returns to the office.  He was scheduled to have cystoscopy today for further evaluation of his persistent microscopic hematuria but refused.  He is also elected not to proceed with urodynamics as per my recommendations.  He did agree to an interval renal ultrasound which shows normal bilateral kidneys without hydronephrosis.  His bladder is grossly abnormal on ultrasound with evidence of incomplete bladder emptying, large diverticula and BPH with a estimated volume around 90 cc.  He continues to be on no BPH medications at this time.  He continues to have severe obstructive urinary symptoms which are both irritative and obstructive.  Please see previous note for details.   PMH: Past Medical History:  Diagnosis Date  . Anxiety   . High cholesterol   . Prostate enlargement     Surgical History: No past surgical history on file.  Home Medications:  Allergies as of 05/11/2018      Reactions   Bee Venom Anaphylaxis   Wasp Venom Anaphylaxis      Medication List       Accurate as of May 11, 2018 12:42 PM. Always use your most recent med list.        atorvastatin 10 MG tablet Commonly known as:  LIPITOR Take 1 tablet (10 mg total) by mouth daily.   finasteride 5 MG tablet Commonly known as:  PROSCAR Take 1 tablet (5 mg total) by mouth daily.   PARoxetine 25 MG 24 hr tablet Commonly known as:  PAXIL-CR TAKE 1 TABLET (25 MG TOTAL) BY MOUTH DAILY.   tamsulosin 0.4 MG Caps capsule Commonly known as:  FLOMAX Take 1 capsule (0.4 mg total) by mouth daily.   VIAGRA 100 MG tablet Generic drug:   sildenafil TAKE 1 TABLET BY MOUTH EVERY DAY AS NEEDED       Allergies:  Allergies  Allergen Reactions  . Bee Venom Anaphylaxis  . Wasp Venom Anaphylaxis    Family History: Family History  Adopted: Yes  Family history unknown: Yes    Social History:  reports that he has never smoked. He has quit using smokeless tobacco.  His smokeless tobacco use included chew. He reports that he does not drink alcohol or use drugs.  ROS: UROLOGY Frequent Urination?: No Hard to postpone urination?: No Burning/pain with urination?: No Get up at night to urinate?: No Leakage of urine?: No Urine stream starts and stops?: Yes Trouble starting stream?: Yes Do you have to strain to urinate?: Yes Blood in urine?: No Urinary tract infection?: No Sexually transmitted disease?: No Injury to kidneys or bladder?: No Painful intercourse?: No Weak stream?: Yes Erection problems?: Yes Penile pain?: No  Gastrointestinal Nausea?: No Vomiting?: No Indigestion/heartburn?: No Diarrhea?: No  Constitutional Fever: No Night sweats?: No Weight loss?: No Fatigue?: No  Skin Skin rash/lesions?: No Itching?: No  Eyes Blurred vision?: No Double vision?: No  Ears/Nose/Throat Sore throat?: No Sinus problems?: No  Hematologic/Lymphatic Swollen glands?: No Easy bruising?: No  Cardiovascular Leg swelling?: No Chest pain?: No  Respiratory Cough?: No Shortness of breath?: No  Endocrine Excessive thirst?: No  Musculoskeletal Back pain?: No Joint pain?: No  Neurological Headaches?: No Dizziness?:  No  Psychologic Depression?: No Anxiety?: No  Physical Exam: BP (!) 143/92   Pulse 63   Ht 6' (1.829 m)   Wt 210 lb (95.3 kg)   BMI 28.48 kg/m   Constitutional:  Alert and oriented, No acute distress. HEENT: [place] AT, moist mucus membranes.  Trachea midline, no masses. Cardiovascular: No clubbing, cyanosis, or edema. Respiratory: Normal respiratory effort, no increased work of  breathing. Skin: No rashes, bruises or suspicious lesions. Neurologic: Grossly intact, no focal deficits, moving all 4 extremities. Psychiatric: Normal mood and affect.  Laboratory Data: Lab Results  Component Value Date   WBC 7.0 03/19/2018   HGB 14.7 03/19/2018   HCT 45.1 03/19/2018   MCV 92 03/19/2018   PLT 306 03/19/2018    Lab Results  Component Value Date   CREATININE 1.09 03/19/2018    Lab Results  Component Value Date   TESTOSTERONE 282 03/19/2018    Urinalysis Results for orders placed or performed during the hospital encounter of 05/11/18  Urinalysis, Complete w Microscopic  Result Value Ref Range   Color, Urine YELLOW YELLOW   APPearance HAZY (A) CLEAR   Specific Gravity, Urine 1.020 1.005 - 1.030   pH 7.0 5.0 - 8.0   Glucose, UA NEGATIVE NEGATIVE mg/dL   Hgb urine dipstick LARGE (A) NEGATIVE   Bilirubin Urine NEGATIVE NEGATIVE   Ketones, ur NEGATIVE NEGATIVE mg/dL   Protein, ur 100 (A) NEGATIVE mg/dL   Nitrite NEGATIVE NEGATIVE   Leukocytes, UA TRACE (A) NEGATIVE   Squamous Epithelial / LPF 0-5 0 - 5   WBC, UA 21-50 0 - 5 WBC/hpf   RBC / HPF 11-20 0 - 5 RBC/hpf   Bacteria, UA FEW (A) NONE SEEN   Mucus PRESENT    Granular Casts, UA PRESENT      Pertinent Imaging: Results for orders placed during the hospital encounter of 04/18/18  US RENAL   Narrative CLINICAL DATA:  Benign prostatic hypertrophy, chronic urinary retention  EXAM: RENAL / URINARY TRACT ULTRASOUND COMPLETE  COMPARISON:  CT abdomen and pelvis 04/09/2014  FINDINGS: Right Kidney:  Renal measurements: 11.8 x 6.7 x 7.1 cm = volume: 295.9 mL. Normal cortical thickness. Upper normal cortical echogenicity. No mass, hydronephrosis or shadowing calcification.  Left Kidney:  Renal measurements: 11.5 x 6.5 x 5.9 cm = volume: 226.6 mL. No mass, hydronephrosis, or shadowing calcification.  Bladder:  Distended, calculated prevoid volume 1471 mL with a large postvoid residual 961 mL.  BILATERAL ureteral jets visualized. Large LEFT and small posterior RIGHT bladder diverticula identified. No definite bladder mass.  Prostatic enlargement 5.1 x 7.1 x 4.7 cm = volume 90 mL.  IMPRESSION: Bladder diverticula larger on LEFT and small on RIGHT.  Prostatic enlargement.  No renal sonographic abnormalities identified.   Electronically Signed   By: Lavonia Dana M.D.   On: 04/18/2018 15:15    RUS personally reviewed  Assessment & Plan:    1. Benign prostatic hyperplasia with urinary obstruction Chronic outlet obstruction with severe retention and large bladder diverticulum Symptomatic We again today had a similar discussion as last visit regarding how to proceed  At this point time, he is failed medical management and this is a surgical problem as per previous conversation Prior to offering any surgical procedure which may include an outlet procedure +/-diverticulectomy, urodynamics is essential for surgical planning and to assess his overall bladder function as well as cystoscopy for surgical planning purposes We again today had the same conversation as last time regarding the  need for urodynamics as well as the the role it would play moving forward At this point time he continues to be highly concerned about the cost and does not want to pursue this He has agreed to resume finasteride and Flomax for the time being He understands that continued chronic outlet obstruction may lead to upper tract compromise and renal failure as well as continued and worsening bladder dysfunction He will return when he is ready to pursue further diagnostic evaluation and intervention - finasteride (PROSCAR) 5 MG tablet; Take 1 tablet (5 mg total) by mouth daily.  Dispense: 90 tablet; Refill: 3 - tamsulosin (FLOMAX) 0.4 MG CAPS capsule; Take 1 capsule (0.4 mg total) by mouth daily.  Dispense: 90 capsule; Refill: 3  2. Acquired bladder diverticulum Secondary to #1  3. Microscopic  hematuria Scheduled for but refused cystoscopy today, highly recommended rule out underlying malignancy   F/u when ready  Hollice Espy, MD  [facility] [address] [place], [place] [address] [phone]  I spent 25 min with this patient of which greater than 50% was spent in counseling and coordination of care with the patient.

## 2018-05-14 ENCOUNTER — Other Ambulatory Visit: Payer: Self-pay | Admitting: Internal Medicine

## 2018-05-14 DIAGNOSIS — F419 Anxiety disorder, unspecified: Secondary | ICD-10-CM

## 2018-05-16 ENCOUNTER — Other Ambulatory Visit: Payer: Self-pay | Admitting: Urology

## 2018-05-16 DIAGNOSIS — N401 Enlarged prostate with lower urinary tract symptoms: Principal | ICD-10-CM

## 2018-05-16 DIAGNOSIS — N138 Other obstructive and reflux uropathy: Secondary | ICD-10-CM

## 2018-05-16 MED ORDER — TAMSULOSIN HCL 0.4 MG PO CAPS: 0.4000 mg | ORAL_CAPSULE | Freq: Every day | ORAL | 3 refills | Status: DC

## 2018-05-16 NOTE — Telephone Encounter (Signed)
Tamsulosin rx had been printed and not escribed.  Re ordered and escribed to pharmacy.  Informed patient.

## 2018-05-16 NOTE — Telephone Encounter (Signed)
Pt lmom stating that at his last visit Dr. Erlene Quan was supposed to send in 2 Rx for him. Went to [facility] and they only had 1 Rx for him. Pt states the Flomax Rx wasn't sent in to his pharmacy, Please advise and call pt. Thank you.

## 2019-03-10 ENCOUNTER — Other Ambulatory Visit: Payer: Self-pay | Admitting: Internal Medicine

## 2019-03-10 DIAGNOSIS — E782 Mixed hyperlipidemia: Secondary | ICD-10-CM

## 2019-03-22 ENCOUNTER — Encounter: Payer: BLUE CROSS/BLUE SHIELD | Admitting: Internal Medicine

## 2019-04-29 ENCOUNTER — Other Ambulatory Visit: Payer: Self-pay | Admitting: Urology

## 2019-04-29 ENCOUNTER — Other Ambulatory Visit: Payer: Self-pay | Admitting: Internal Medicine

## 2019-04-29 DIAGNOSIS — F419 Anxiety disorder, unspecified: Secondary | ICD-10-CM

## 2019-04-29 DIAGNOSIS — N138 Other obstructive and reflux uropathy: Secondary | ICD-10-CM

## 2019-04-29 NOTE — Telephone Encounter (Signed)
Patient decided to schedule physical sometime next year and that he will call back.

## 2019-05-03 ENCOUNTER — Telehealth: Payer: Self-pay | Admitting: Urology

## 2019-05-03 NOTE — Telephone Encounter (Signed)
Pt called office and needs a refill for Finasteride and Tamsulosin.

## 2019-05-06 NOTE — Telephone Encounter (Signed)
Left VM asked to return call, needs to schedule an appointment for refills.

## 2019-05-10 NOTE — Telephone Encounter (Signed)
Pt had called back to schedule an appt, we lost connection. I attempted to call him back, but no answer either ph#.

## 2019-05-13 NOTE — Telephone Encounter (Signed)
Left voicemail to return call. 

## 2019-05-15 ENCOUNTER — Encounter: Payer: Self-pay | Admitting: Physician Assistant

## 2019-05-15 ENCOUNTER — Ambulatory Visit (INDEPENDENT_AMBULATORY_CARE_PROVIDER_SITE_OTHER): Payer: BLUE CROSS/BLUE SHIELD | Admitting: Physician Assistant

## 2019-05-15 ENCOUNTER — Other Ambulatory Visit: Payer: Self-pay

## 2019-05-15 VITALS — BP 118/72 | HR 64 | Ht 72.0 in | Wt 210.0 lb

## 2019-05-15 DIAGNOSIS — N138 Other obstructive and reflux uropathy: Secondary | ICD-10-CM

## 2019-05-15 DIAGNOSIS — N401 Enlarged prostate with lower urinary tract symptoms: Secondary | ICD-10-CM | POA: Diagnosis not present

## 2019-05-15 DIAGNOSIS — R3129 Other microscopic hematuria: Secondary | ICD-10-CM | POA: Diagnosis not present

## 2019-05-15 MED ORDER — TAMSULOSIN HCL 0.4 MG PO CAPS: 0.4000 mg | ORAL_CAPSULE | Freq: Every day | ORAL | 3 refills | Status: DC

## 2019-05-15 MED ORDER — FINASTERIDE 5 MG PO TABS: 5.0000 mg | ORAL_TABLET | Freq: Every day | ORAL | 3 refills | Status: DC

## 2019-05-15 NOTE — Progress Notes (Signed)
05/15/2019 1:40 PM   Charles Turner [birth year] [id number]  CC: Medication refill  HPI: Charles Turner is a 58 y.o. male who presents today for annual visit with medication refill.  Past urologic history significant for BPH with severe BOO, chronic urinary retention, bladder diverticulum, and persistent microscopic hematuria.  He was previously seen by Dr. Erlene Quan for this and started on tamsulosin and finasteride dual therapy.  Today, he reports having run out of his medications approximately 1 week ago with near immediate worsening of urinary symptoms.  IPSS as below.  He states his urinary symptoms are moderately problematic on dual therapy, however he is extremely hesitant to undergo bladder outlet procedures, as he believes that the problem is his bladder rather than his prostate.  He is not interested in proceeding with urodynamics for further evaluation of his bladder.  He states he has tried CIC in the past and was unable to complete this.    IPSS    Row Name 05/15/19 1000         International Prostate Symptom Score   How often have you had the sensation of not emptying your bladder?  Almost always     How often have you had to urinate less than every two hours?  Less than half the time     How often have you found you stopped and started again several times when you urinated?  Almost always     How often have you found it difficult to postpone urination?  Not at All     How often have you had a weak urinary stream?  Almost always     How often have you had to strain to start urination?  Almost always     How many times did you typically get up at night to urinate?  1 Time     Total IPSS Score  23       Quality of Life due to urinary symptoms   If you were to spend the rest of your life with your urinary condition just the way it is now how would you feel about that?  Mostly Disatisfied       PMH: Past Medical History:  Diagnosis Date  . Anxiety   . High  cholesterol   . Prostate enlargement     Surgical History: No past surgical history on file.  Home Medications:  Allergies as of 05/15/2019      Reactions   Bee Venom Anaphylaxis   Wasp Venom Anaphylaxis      Medication List       Accurate as of May 15, 2019  1:40 PM. If you have any questions, ask your nurse or doctor.        atorvastatin 10 MG tablet Commonly known as: LIPITOR TAKE 1 TABLET BY MOUTH EVERY DAY   finasteride 5 MG tablet Commonly known as: PROSCAR Take 1 tablet (5 mg total) by mouth daily.   PARoxetine 25 MG 24 hr tablet Commonly known as: PAXIL-CR TAKE 1 TABLET (25 MG TOTAL) BY MOUTH DAILY.   tamsulosin 0.4 MG Caps capsule Commonly known as: Flomax Take 1 capsule (0.4 mg total) by mouth daily.   Viagra 100 MG tablet Generic drug: sildenafil TAKE 1 TABLET BY MOUTH EVERY DAY AS NEEDED       Allergies:  Allergies  Allergen Reactions  . Bee Venom Anaphylaxis  . Wasp Venom Anaphylaxis    Family History: Family History  Adopted: Yes  Family history unknown:  Yes    Social History:   reports that he has never smoked. He has quit using smokeless tobacco.  His smokeless tobacco use included chew. He reports that he does not drink alcohol or use drugs.  ROS: UROLOGY Frequent Urination?: No Hard to postpone urination?: No Burning/pain with urination?: No Get up at night to urinate?: No Leakage of urine?: No Urine stream starts and stops?: Yes Trouble starting stream?: Yes Do you have to strain to urinate?: Yes Blood in urine?: No Urinary tract infection?: No Sexually transmitted disease?: No Injury to kidneys or bladder?: No Painful intercourse?: No Weak stream?: Yes Erection problems?: Yes Penile pain?: No  Gastrointestinal Nausea?: No Vomiting?: No Indigestion/heartburn?: No Diarrhea?: No Constipation?: No  Constitutional Fever: No Night sweats?: No Weight loss?: No Fatigue?: No  Skin Skin rash/lesions?:  No Itching?: No  Eyes Blurred vision?: No Double vision?: No  Ears/Nose/Throat Sore throat?: No Sinus problems?: No  Hematologic/Lymphatic Swollen glands?: No Easy bruising?: No  Cardiovascular Leg swelling?: No Chest pain?: No  Respiratory Cough?: No Shortness of breath?: No  Endocrine Excessive thirst?: No  Musculoskeletal Back pain?: No Joint pain?: No  Neurological Headaches?: No Dizziness?: No  Psychologic Depression?: No Anxiety?: No  Physical Exam: BP 118/72   Pulse 64   Ht 6' (1.829 m)   Wt 210 lb (95.3 kg)   BMI 28.48 kg/m   Constitutional:  Alert and oriented, no acute distress, nontoxic appearing HEENT: [place], AT Cardiovascular: No clubbing, cyanosis, or edema Respiratory: Normal respiratory effort, no increased work of breathing GU: Normal sphincter tone, enlarged, 40+ cc prostate without nodules or induration Skin: No rashes, bruises or suspicious lesions Neurologic: Grossly intact, no focal deficits, moving all 4 extremities Psychiatric: Normal mood and affect  Assessment & Plan:   1. Benign prostatic hyperplasia with urinary obstruction 58 year old male with a history of BPH with BOO, chronic urinary retention, and bladder diverticulum presents for routine follow-up and refill of tamsulosin and finasteride.  Patient has failed CIC and is not interested in bladder outlet procedures at this time, stating he will wait until he is unable to urinate at all.  He also declines urodynamics for further evaluation of bladder function today.  DRE significant only for enlarged gland today.  Will reorder tamsulosin and finasteride as well as PSA today.  I counseled the patient on signs and symptoms of urinary retention, including lower abdominal pain, lumbar pain, abdominal distention, and the inability to urinate.  I advised him to contact the office for assistance if he develops these symptoms during routine office hours, 8 AM to 5 PM Monday through Friday.   If outside those hours, I advised him to proceed to the emergency department. He expressed understanding. - PSA - tamsulosin (FLOMAX) 0.4 MG CAPS capsule; Take 1 capsule (0.4 mg total) by mouth daily.  Dispense: 90 capsule; Refill: 3 - finasteride (PROSCAR) 5 MG tablet; Take 1 tablet (5 mg total) by mouth daily.  Dispense: 90 tablet; Refill: 3  2. Microscopic hematuria Patient has a history of microscopic hematuria and has previously refused cystoscopy for further evaluation of this.  Given his history, bleeding is likely prostatic in origin.  I explained to patient that I would like to repeat urinalysis today, however if microscopic hematuria persists, I will be recommending cystoscopy and possibly CT imaging for further evaluation of this.  Patient is adamantly opposed to undergoing cystoscopy or further imaging at this time.  Given this, I am not ordering UA today.  I  counseled patient extensively that in the absence of the clinical data that urinalysis, cystoscopy, and or CT imaging can provide, I cannot guarantee that he does not have life-threatening conditions including bladder and or kidney cancers.  I explained that I cannot offer therapy for treatment of conditions that I am unaware of.  I explained that foregoing work-up for further evaluation of microscopic hematuria will allow these conditions to worsen, if present.  He stated that he understands the risks of refusing these and that he wishes to do so regardless.  Return in about 1 year (around 05/14/2020) for PVR, IPSS, DRE, PSA.   I spent 25 min with this patient, of which greater than 50% was spent in counseling and coordination of care with the patient.    Debroah Loop, PA-C  [facility] [address] [place], [place] [address] [phone]

## 2019-05-16 LAB — PSA: Prostate Specific Ag, Serum: 3.3 ng/mL (ref 0.0–4.0)

## 2019-06-06 ENCOUNTER — Other Ambulatory Visit: Payer: Self-pay | Admitting: Internal Medicine

## 2019-06-06 DIAGNOSIS — F419 Anxiety disorder, unspecified: Secondary | ICD-10-CM

## 2019-06-07 ENCOUNTER — Telehealth: Payer: Self-pay | Admitting: Physician Assistant

## 2019-06-07 NOTE — Telephone Encounter (Signed)
I just spoke with the patient via telephone to report the results of his recent PSA.  I explained that his PSA has risen only slightly to 3.3 from 3.1 last year.  However, I explained that since he restarted finasteride 1 year ago, I would expect his PSA level to have dropped by half in that time.  Additionally, I explained that since he had been on finasteride previously and subsequently stopped it, it is possible that his PSA value was not at its true baseline when he restarted it last year.  Thus, I am struggling to interpret his most recent PSA.  I recommend a repeat PSA in 3 months to trend this value.  He expressed understanding.  Please contact the patient to schedule him for a lab visit for PSA draw in 3 months.

## 2019-06-07 NOTE — Telephone Encounter (Signed)
Appt made and mailed.

## 2019-07-18 ENCOUNTER — Encounter: Payer: BLUE CROSS/BLUE SHIELD | Admitting: Internal Medicine

## 2019-07-31 ENCOUNTER — Other Ambulatory Visit: Payer: Self-pay

## 2019-07-31 ENCOUNTER — Encounter: Payer: Self-pay | Admitting: Internal Medicine

## 2019-07-31 ENCOUNTER — Ambulatory Visit (INDEPENDENT_AMBULATORY_CARE_PROVIDER_SITE_OTHER): Payer: BLUE CROSS/BLUE SHIELD | Admitting: Internal Medicine

## 2019-07-31 VITALS — BP 130/80 | HR 63 | Temp 96.8°F | Ht 72.0 in | Wt 219.0 lb

## 2019-07-31 DIAGNOSIS — Z1211 Encounter for screening for malignant neoplasm of colon: Secondary | ICD-10-CM | POA: Diagnosis not present

## 2019-07-31 DIAGNOSIS — E782 Mixed hyperlipidemia: Secondary | ICD-10-CM

## 2019-07-31 DIAGNOSIS — F419 Anxiety disorder, unspecified: Secondary | ICD-10-CM | POA: Diagnosis not present

## 2019-07-31 DIAGNOSIS — Z91030 Bee allergy status: Secondary | ICD-10-CM

## 2019-07-31 DIAGNOSIS — Z0000 Encounter for general adult medical examination without abnormal findings: Secondary | ICD-10-CM | POA: Diagnosis not present

## 2019-07-31 LAB — POCT URINALYSIS DIPSTICK
Bilirubin, UA: NEGATIVE
Glucose, UA: NEGATIVE
Ketones, UA: NEGATIVE
Leukocytes, UA: NEGATIVE
Nitrite, UA: NEGATIVE
Protein, UA: POSITIVE — AB
Spec Grav, UA: 1.02 (ref 1.010–1.025)
Urobilinogen, UA: 0.2 E.U./dL
pH, UA: 6 (ref 5.0–8.0)

## 2019-07-31 MED ORDER — PAROXETINE HCL ER 25 MG PO TB24: 25.0000 mg | ORAL_TABLET | Freq: Every day | ORAL | 0 refills | Status: DC

## 2019-07-31 MED ORDER — EPINEPHRINE 0.3 MG/0.3ML IJ SOAJ: 0.3000 mg | Freq: Once | INTRAMUSCULAR | 0 refills | Status: AC

## 2019-07-31 MED ORDER — SILDENAFIL CITRATE 100 MG PO TABS: 100.0000 mg | ORAL_TABLET | Freq: Every day | ORAL | 5 refills | Status: DC | PRN

## 2019-07-31 NOTE — Progress Notes (Signed)
Date:  07/31/2019   Name:  Charles Turner   DOB:  August 03, 1960   MRN:  [id number]   Chief Complaint: Annual Exam (RF Viagra. RF on Epi-pens. Wants to discuss covid vaccine.) and Joint Pain (Wants to recomended meds for joint pain after working out. Or a daily supplement. When pushing self harder it gets more painful. ) Charles Turner is a 60 y.o. male who presents today for his Complete Annual Exam. He feels fairly well. He reports exercising regularly. He reports he is sleeping well.   CRC screen - Cologuard 03/2016  There is no immunization history on file for this patient.  Hyperlipidemia The problem is controlled. Pertinent negatives include no chest pain, myalgias or shortness of breath. Current antihyperlipidemic treatment includes statins. The current treatment provides significant improvement of lipids.  Anxiety Presents for follow-up visit. Patient reports no chest pain, dizziness, palpitations or shortness of breath. Symptoms occur occasionally. The severity of symptoms is moderate. The quality of sleep is good.   Compliance with medications is 76-100%.  BPH - followed by Urology.  He still has emptying issues but they are tolerable with the medication.  He has not followed up recently. Joint pain - shoulder and hips after working out at the gym - lifting weights and resistance machines, treadmill.  He takes advil as needed but wonders about joint supplements.  Lab Results  Component Value Date   CREATININE 1.09 03/19/2018   BUN 17 03/19/2018   NA 140 03/19/2018   K 4.4 03/19/2018   CL 100 03/19/2018   CO2 26 03/19/2018   Lab Results  Component Value Date   CHOL 205 (H) 03/19/2018   HDL 77 03/19/2018   LDLCALC 100 (H) 03/19/2018   TRIG 138 03/19/2018   CHOLHDL 2.7 03/19/2018   Lab Results  Component Value Date   TSH 3.350 03/16/2017   No results found for: HGBA1C   Review of Systems  Constitutional: Negative for appetite change, chills, diaphoresis,  fatigue and unexpected weight change.  HENT: Negative for hearing loss, tinnitus, trouble swallowing and voice change.   Eyes: Negative for visual disturbance.  Respiratory: Negative for choking, shortness of breath and wheezing.   Cardiovascular: Negative for chest pain, palpitations and leg swelling.  Gastrointestinal: Negative for abdominal pain, blood in stool, constipation and diarrhea.  Genitourinary: Negative for difficulty urinating, dysuria and frequency.  Musculoskeletal: Negative for arthralgias, back pain and myalgias.  Skin: Negative for color change and rash.  Neurological: Negative for dizziness, syncope and headaches.  Hematological: Negative for adenopathy.  Psychiatric/Behavioral: Negative for dysphoric mood and sleep disturbance.    Patient Active Problem List   Diagnosis Date Noted  . Primary osteoarthritis of both knees 03/15/2016  . Urinary retention due to benign prostatic hyperplasia 12/22/2015  . Chronic anxiety 06/17/2015  . Bee sting allergy 06/17/2015  . Mixed hyperlipidemia 05/05/2015  . Bladder neoplasm 05/05/2015  . Abnormal LFTs 05/05/2015  . Failure of erection 05/05/2015  . Eunuchoidism 05/05/2015    Allergies  Allergen Reactions  . Bee Venom Anaphylaxis  . Wasp Venom Anaphylaxis    History reviewed. No pertinent surgical history.  Social History   Tobacco Use  . Smoking status: Never Smoker  . Smokeless tobacco: Former Systems developer    Types: Chew  Substance Use Topics  . Alcohol use: No    Alcohol/week: 0.0 standard drinks  . Drug use: No     Medication list has been reviewed and updated.  Current Meds  Medication Sig  . atorvastatin (LIPITOR) 10 MG tablet TAKE 1 TABLET BY MOUTH EVERY DAY  . finasteride (PROSCAR) 5 MG tablet Take 1 tablet (5 mg total) by mouth daily.  Marland Kitchen PARoxetine (PAXIL-CR) 25 MG 24 hr tablet TAKE 1 TABLET (25 MG TOTAL) BY MOUTH DAILY.  . tamsulosin (FLOMAX) 0.4 MG CAPS capsule Take 1 capsule (0.4 mg total) by mouth  daily.  Marland Kitchen VIAGRA 100 MG tablet TAKE 1 TABLET BY MOUTH EVERY DAY AS NEEDED    PHQ 2/9 Scores 07/31/2019 03/19/2018 03/16/2017 03/15/2016  PHQ - 2 Score 0 0 0 0  PHQ- 9 Score 1 - - -    BP Readings from Last 3 Encounters:  07/31/19 130/80  05/15/19 118/72  05/11/18 (!) 143/92    Physical Exam Vitals and nursing note reviewed.  Constitutional:      Appearance: Normal appearance. He is well-developed.  HENT:     Head: Normocephalic.     Right Ear: Tympanic membrane, ear canal and external ear normal.     Left Ear: Tympanic membrane, ear canal and external ear normal.     Nose: Nose normal.     Mouth/Throat:     Pharynx: Uvula midline.  Eyes:     Conjunctiva/sclera: Conjunctivae normal.     Pupils: Pupils are equal, round, and reactive to light.  Neck:     Thyroid: No thyromegaly.     Vascular: No carotid bruit.  Cardiovascular:     Rate and Rhythm: Normal rate and regular rhythm.     Heart sounds: Normal heart sounds.  Pulmonary:     Effort: Pulmonary effort is normal.     Breath sounds: Normal breath sounds. No wheezing.  Chest:     Breasts:        Right: No mass.        Left: No mass.  Abdominal:     General: Bowel sounds are normal.     Palpations: Abdomen is soft.     Tenderness: There is no abdominal tenderness.  Musculoskeletal:        General: Normal range of motion.     Cervical back: Normal range of motion and neck supple.     Right lower leg: No edema.     Left lower leg: No edema.  Lymphadenopathy:     Cervical: No cervical adenopathy.  Skin:    General: Skin is warm and dry.     Capillary Refill: Capillary refill takes less than 2 seconds.  Neurological:     General: No focal deficit present.     Mental Status: He is alert and oriented to person, place, and time.     Deep Tendon Reflexes: Reflexes are normal and symmetric.  Psychiatric:        Attention and Perception: Attention normal.        Mood and Affect: Mood normal.        Speech: Speech  normal.        Behavior: Behavior normal.        Thought Content: Thought content normal.        Judgment: Judgment normal.     Wt Readings from Last 3 Encounters:  07/31/19 219 lb (99.3 kg)  05/15/19 210 lb (95.3 kg)  05/11/18 210 lb (95.3 kg)    BP 130/80   Pulse 63   Temp (!) 96.8 F (36 C) (Temporal)   Ht 6' (1.829 m)   Wt 219 lb (99.3 kg)   SpO2 95%   BMI  29.70 kg/m   Assessment and Plan: 1. Annual physical exam Normal exam Continue regular exercise, healthy diet Can try glucosamine/chondroitin for joint pains - CBC with Differential/Platelet - POCT urinalysis dipstick  2. Mixed hyperlipidemia Tolerating statin medication without side effects at this time Continue same therapy without change at this time. - Comprehensive metabolic panel - Lipid panel  3. Chronic anxiety No recurrence of panic attacks but still has moderate anxiety He is happy with the current dose and will continue this - PARoxetine (PAXIL-CR) 25 MG 24 hr tablet; Take 1 tablet (25 mg total) by mouth daily.  Dispense: 90 tablet; Refill: 0  4. Colon cancer screening Due for 3 yr cologuard - Cologuard  5. Bee sting allergy If he gets Covid vaccine he will need to have his EpiPen and be monitored for at least 30 minutes - EPINEPHrine 0.3 mg/0.3 mL IJ SOAJ injection; Inject 0.3 mLs (0.3 mg total) into the muscle once for 1 dose.  Dispense: 0.3 mL; Refill: 0   Partially dictated using Editor, commissioning. Any errors are unintentional.  Halina Maidens, MD [facility]  07/31/2019

## 2019-08-01 ENCOUNTER — Telehealth: Payer: Self-pay

## 2019-08-01 LAB — CBC WITH DIFFERENTIAL/PLATELET
Basophils Absolute: 0 10*3/uL (ref 0.0–0.2)
Basos: 1 %
EOS (ABSOLUTE): 0.1 10*3/uL (ref 0.0–0.4)
Eos: 1 %
Hematocrit: 48 % (ref 37.5–51.0)
Hemoglobin: 15.3 g/dL (ref 13.0–17.7)
Immature Grans (Abs): 0 10*3/uL (ref 0.0–0.1)
Immature Granulocytes: 0 %
Lymphocytes Absolute: 2.4 10*3/uL (ref 0.7–3.1)
Lymphs: 37 %
MCH: 28.9 pg (ref 26.6–33.0)
MCHC: 31.9 g/dL (ref 31.5–35.7)
MCV: 91 fL (ref 79–97)
Monocytes Absolute: 0.6 10*3/uL (ref 0.1–0.9)
Monocytes: 9 %
Neutrophils Absolute: 3.5 10*3/uL (ref 1.4–7.0)
Neutrophils: 52 %
Platelets: 306 10*3/uL (ref 150–450)
RBC: 5.29 x10E6/uL (ref 4.14–5.80)
RDW: 13.2 % (ref 11.6–15.4)
WBC: 6.6 10*3/uL (ref 3.4–10.8)

## 2019-08-01 LAB — COMPREHENSIVE METABOLIC PANEL
ALT: 27 IU/L (ref 0–44)
AST: 25 IU/L (ref 0–40)
Albumin/Globulin Ratio: 2 (ref 1.2–2.2)
Albumin: 4.6 g/dL (ref 3.8–4.9)
Alkaline Phosphatase: 59 IU/L (ref 39–117)
BUN/Creatinine Ratio: 18 (ref 9–20)
BUN: 18 mg/dL (ref 6–24)
Bilirubin Total: 0.4 mg/dL (ref 0.0–1.2)
CO2: 23 mmol/L (ref 20–29)
Calcium: 9.9 mg/dL (ref 8.7–10.2)
Chloride: 101 mmol/L (ref 96–106)
Creatinine, Ser: 0.99 mg/dL (ref 0.76–1.27)
GFR calc Af Amer: 97 mL/min/{1.73_m2} (ref >59)
GFR calc non Af Amer: 84 mL/min/{1.73_m2} (ref >59)
Globulin, Total: 2.3 g/dL (ref 1.5–4.5)
Glucose: 105 mg/dL — ABNORMAL HIGH (ref 65–99)
Potassium: 4.2 mmol/L (ref 3.5–5.2)
Sodium: 140 mmol/L (ref 134–144)
Total Protein: 6.9 g/dL (ref 6.0–8.5)

## 2019-08-01 LAB — LIPID PANEL
Chol/HDL Ratio: 3.7 ratio (ref 0.0–5.0)
Cholesterol, Total: 223 mg/dL — ABNORMAL HIGH (ref 100–199)
HDL: 61 mg/dL (ref >39)
LDL Chol Calc (NIH): 133 mg/dL — ABNORMAL HIGH (ref 0–99)
Triglycerides: 162 mg/dL — ABNORMAL HIGH (ref 0–149)
VLDL Cholesterol Cal: 29 mg/dL (ref 5–40)

## 2019-08-01 NOTE — Telephone Encounter (Signed)
Wife wendi answered the phone and ask for information on labs. -Kie

## 2019-09-02 DIAGNOSIS — Z1211 Encounter for screening for malignant neoplasm of colon: Secondary | ICD-10-CM | POA: Diagnosis not present

## 2019-09-02 LAB — COLOGUARD

## 2019-09-04 ENCOUNTER — Other Ambulatory Visit: Payer: Self-pay

## 2019-09-04 DIAGNOSIS — N138 Other obstructive and reflux uropathy: Secondary | ICD-10-CM

## 2019-09-05 ENCOUNTER — Encounter: Payer: Self-pay | Admitting: Urology

## 2019-09-05 ENCOUNTER — Other Ambulatory Visit: Payer: BLUE CROSS/BLUE SHIELD

## 2019-09-05 ENCOUNTER — Other Ambulatory Visit
Admission: RE | Admit: 2019-09-05 | Discharge: 2019-09-05 | Disposition: A | Discharge status: Home / Self Care | Payer: BLUE CROSS/BLUE SHIELD | Attending: Physician Assistant | Admitting: Physician Assistant

## 2019-09-05 DIAGNOSIS — N138 Other obstructive and reflux uropathy: Secondary | ICD-10-CM | POA: Insufficient documentation

## 2019-09-05 DIAGNOSIS — N401 Enlarged prostate with lower urinary tract symptoms: Secondary | ICD-10-CM | POA: Insufficient documentation

## 2019-09-05 LAB — PSA: Prostatic Specific Antigen: 2.41 ng/mL (ref 0.00–4.00)

## 2019-09-05 NOTE — Addendum Note (Signed)
Addended by: Santiago Bur on: 09/05/2019 09:18 AM   Modules accepted: Orders

## 2019-09-06 LAB — COLOGUARD
COLOGUARD: NEGATIVE
Cologuard: NEGATIVE

## 2019-09-08 ENCOUNTER — Other Ambulatory Visit: Payer: Self-pay | Admitting: Internal Medicine

## 2019-09-10 ENCOUNTER — Telehealth: Payer: Self-pay | Admitting: Physician Assistant

## 2019-09-10 ENCOUNTER — Telehealth: Payer: Self-pay

## 2019-09-10 NOTE — Telephone Encounter (Signed)
Please contact the patient and inform him that his recent PSA result is very encouraging.  His PSA was 2.41, which represents a notable decrease from his value of 3.3 in December.  No further intervention is indicated at this time.  We will continue with plans for follow-up with me in clinic this coming December.

## 2019-09-10 NOTE — Telephone Encounter (Signed)
Called and left VM reminding pt to complete Cologuard test. Told him to let me know if he has not received it.  CM

## 2019-09-10 NOTE — Telephone Encounter (Signed)
Notified patient as advised by leaving a detailed msg on voicemail per DPR.

## 2019-09-19 ENCOUNTER — Ambulatory Visit: Payer: BLUE CROSS/BLUE SHIELD | Attending: Internal Medicine

## 2019-09-19 DIAGNOSIS — Z23 Encounter for immunization: Secondary | ICD-10-CM

## 2019-09-19 NOTE — Progress Notes (Signed)
   Covid-19 Vaccination Clinic  Name:  Gregori Okelley    MRN: [id number] DOB: [birth year]  09/19/2019  Mr. Rodney was observed post Covid-19 immunization for 15 minutes without incident. He was provided with Vaccine Information Sheet and instruction to access the V-Safe system.   Mr. Casini was instructed to call 911 with any severe reactions post vaccine: Marland Kitchen Difficulty breathing  . Swelling of face and throat  . A fast heartbeat  . A bad rash all over body  . Dizziness and weakness   Immunizations Administered    Name Date Dose VIS Date Route   Pfizer COVID-19 Vaccine 09/19/2019  8:13 AM 0.3 mL 07/24/2018 Intramuscular   Manufacturer: [place]   Lot: [id number]   [place]: [id number]

## 2019-09-20 ENCOUNTER — Encounter: Payer: Self-pay | Admitting: Internal Medicine

## 2019-09-20 ENCOUNTER — Telehealth: Payer: Self-pay | Admitting: Internal Medicine

## 2019-09-20 NOTE — Telephone Encounter (Signed)
Called and left Vm informing patient the cologuard test came back negative. Told him we will repeat in 3 years.   CM

## 2019-09-20 NOTE — Telephone Encounter (Signed)
Please let patient know that Cologuard test was negative.  Will repeat in 3 yrs.

## 2019-10-15 ENCOUNTER — Ambulatory Visit: Payer: BLUE CROSS/BLUE SHIELD | Attending: Internal Medicine

## 2019-10-15 DIAGNOSIS — Z23 Encounter for immunization: Secondary | ICD-10-CM

## 2019-10-15 NOTE — Progress Notes (Signed)
   Covid-19 Vaccination Clinic  Name:  Charles Turner    MRN: [id number] DOB: Feb 22, 1961  10/15/2019  [name] was observed post Covid-19 immunization for 15 minutes without incident. He was provided with Vaccine Information Sheet and instruction to access the V-Safe system.   [name] was instructed to call 911 with any severe reactions post vaccine: Marland Kitchen Difficulty breathing  . Swelling of face and throat  . A fast heartbeat  . A bad rash all over body  . Dizziness and weakness   Immunizations Administered    Name Date Dose VIS Date Route   Pfizer COVID-19 Vaccine 10/15/2019 11:01 AM 0.3 mL 07/24/2018 Intramuscular   Manufacturer: [place]   Lot: [id number]   [place]: [id number]

## 2019-11-03 ENCOUNTER — Other Ambulatory Visit: Payer: Self-pay | Admitting: Internal Medicine

## 2019-11-03 DIAGNOSIS — F419 Anxiety disorder, unspecified: Secondary | ICD-10-CM

## 2019-11-03 NOTE — Telephone Encounter (Signed)
Requested Prescriptions  Pending Prescriptions Disp Refills  . PARoxetine (PAXIL-CR) 25 MG 24 hr tablet [Pharmacy Med Name: PAROXETINE ER 25 MG TABLET] 90 tablet 0    Sig: TAKE 1 TABLET BY MOUTH EVERY DAY     Psychiatry:  Antidepressants - SSRI Passed - 11/03/2019  3:27 PM      Passed - Valid encounter within last 6 months    Recent Outpatient Visits          3 months ago Annual physical exam   [facility] Glean Hess, MD   1 year ago Annual physical exam   [facility] Glean Hess, MD   2 years ago Annual physical exam   [facility] Glean Hess, MD   2 years ago Acute non-recurrent sinusitis, unspecified location   [facility] Glean Hess, MD   3 years ago Annual physical exam   [facility] Glean Hess, MD      Future Appointments            In 6 months Luana Shu [facility]   In 9 months Army Melia, Jesse Sans, MD [facility], [place]

## 2019-11-23 IMAGING — US US RENAL
1 series · 14 of 25 positions shown · non-contrast
Comparison: CT abdomen and pelvis 04/09/2014

CLINICAL DATA: Benign prostatic hypertrophy, chronic urinary
retention

EXAM:
RENAL / URINARY TRACT ULTRASOUND COMPLETE

[Series 1: us renal · 0.25mm/px · 79 acquisitions, 14 frames shown]
[im 1/79]
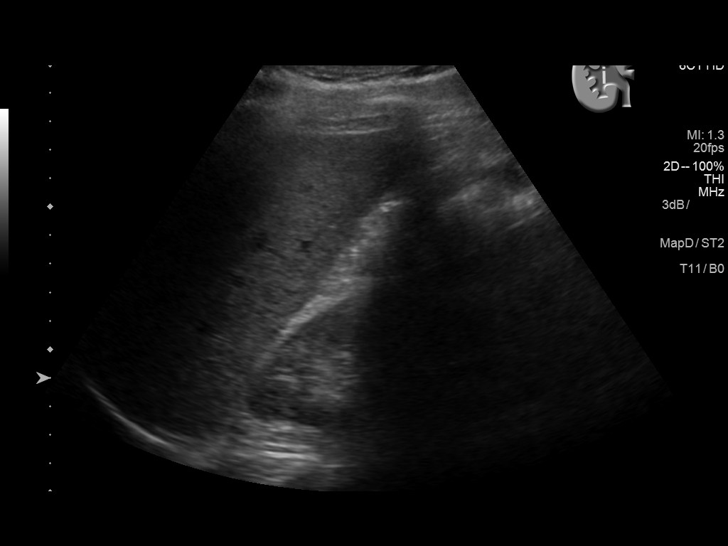
[im 7/79]
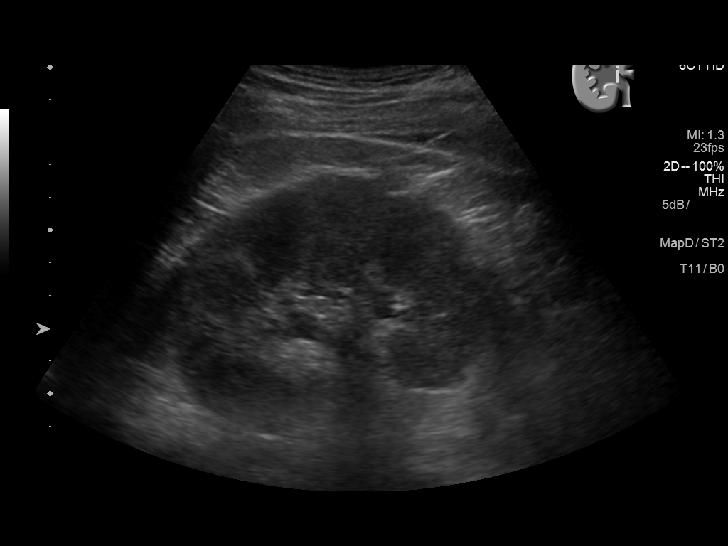
[im 14/79]
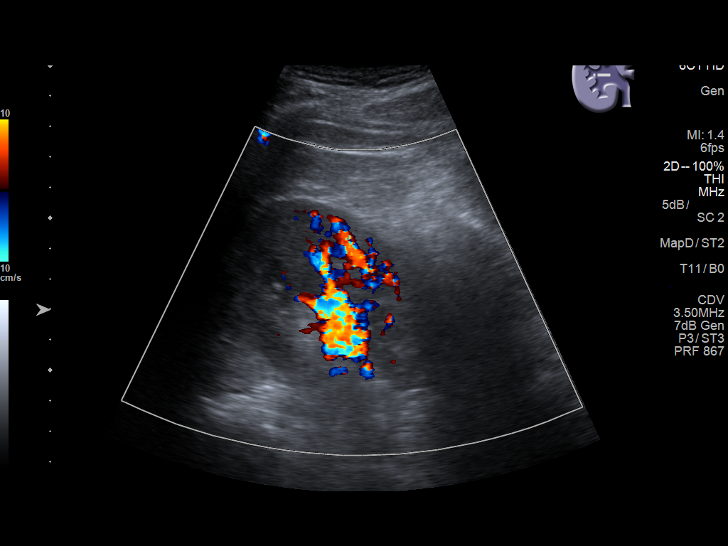
[im 20/79]
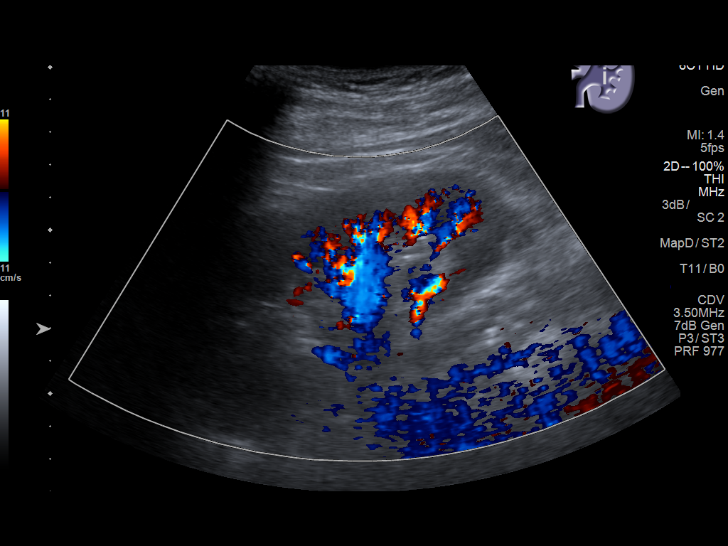
[im 27/79]
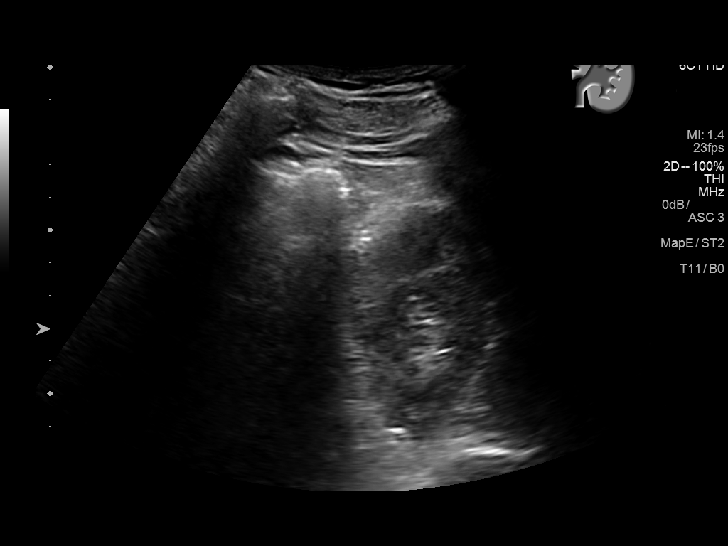
[im 30/79]
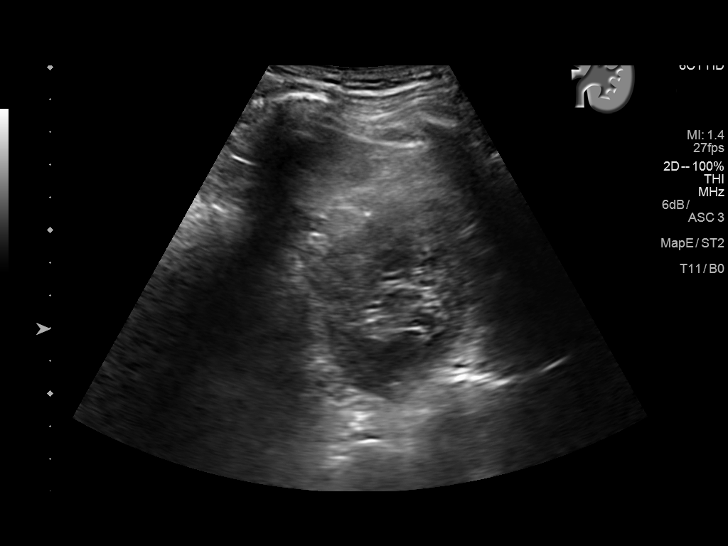
[im 36/79]
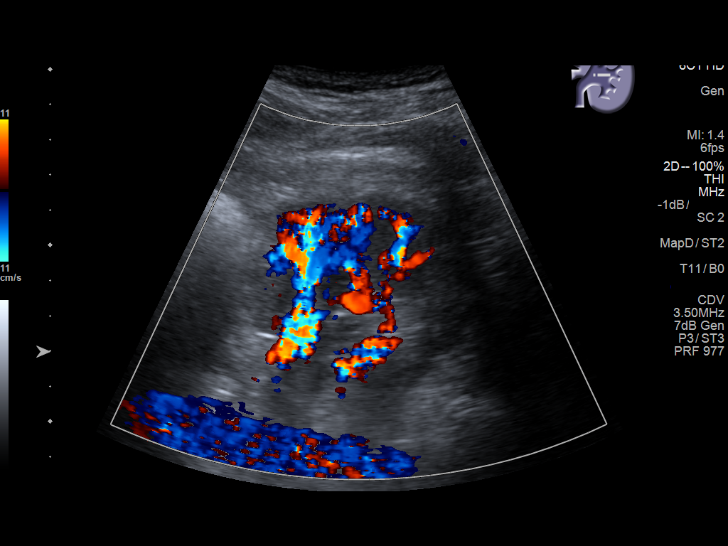
[im 43/79]
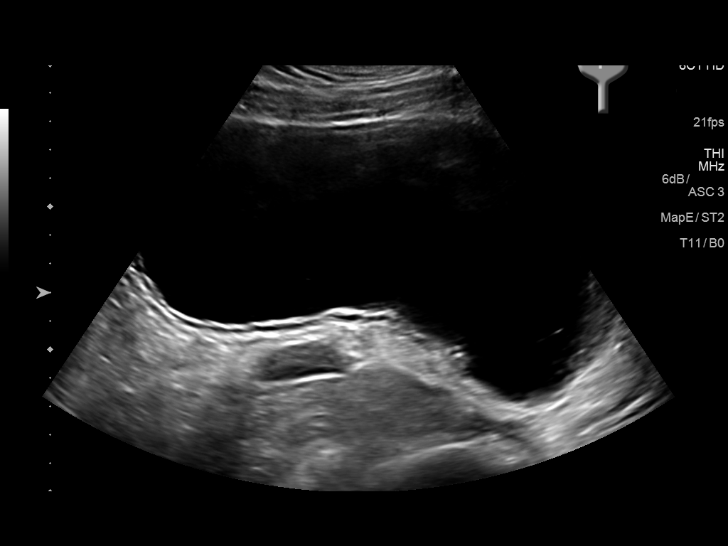
[im 49/79]
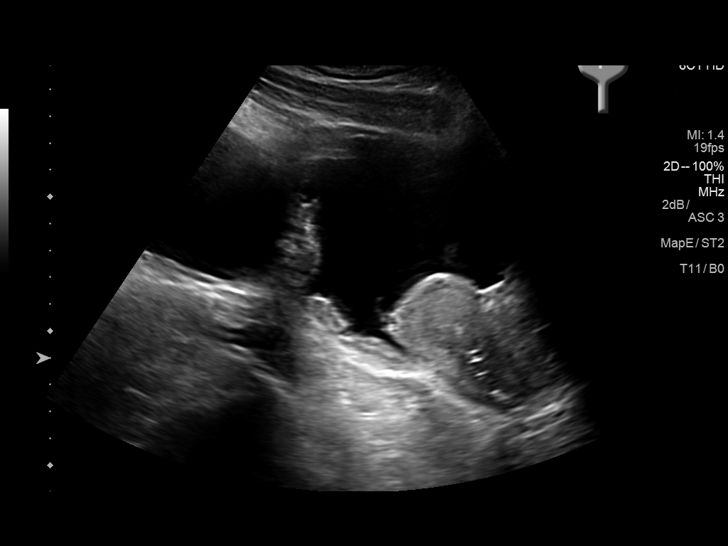
[im 53/79]
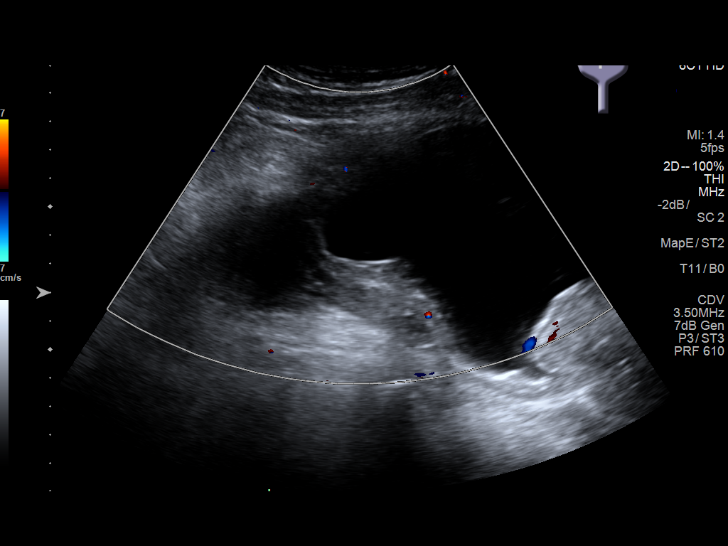
[im 59/79]
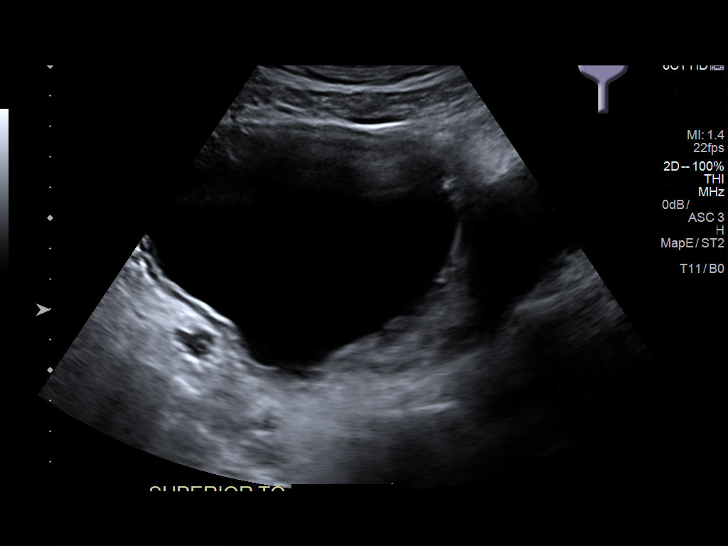
[im 66/79]
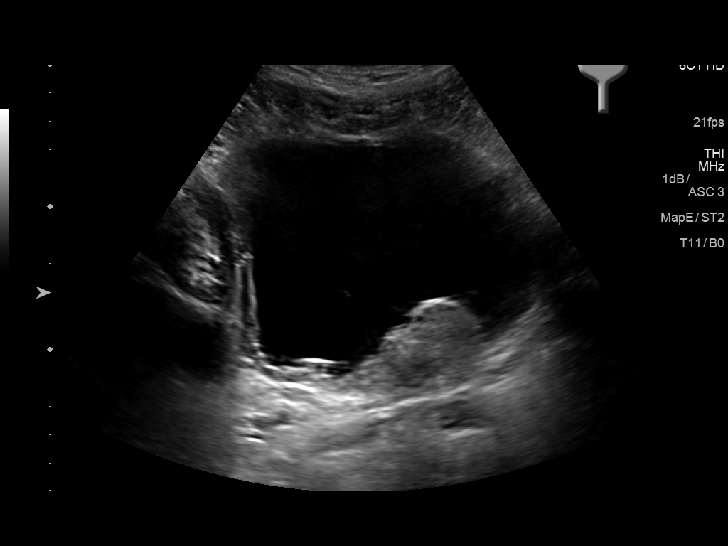
[im 72/79]
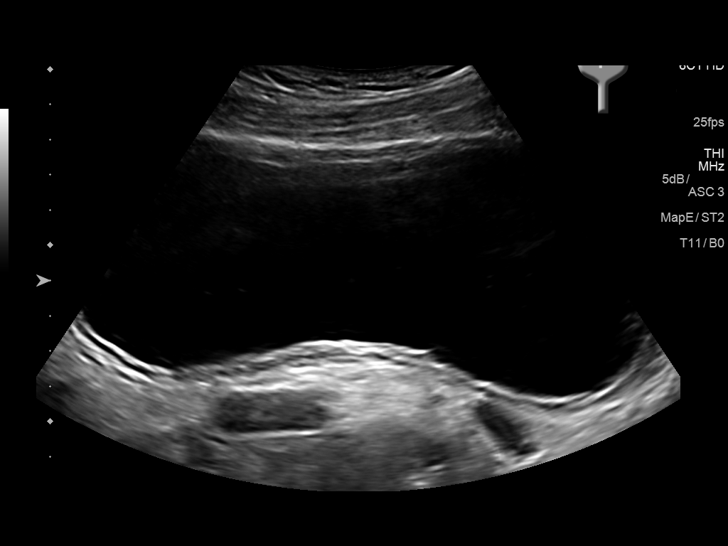
[im 79/79]
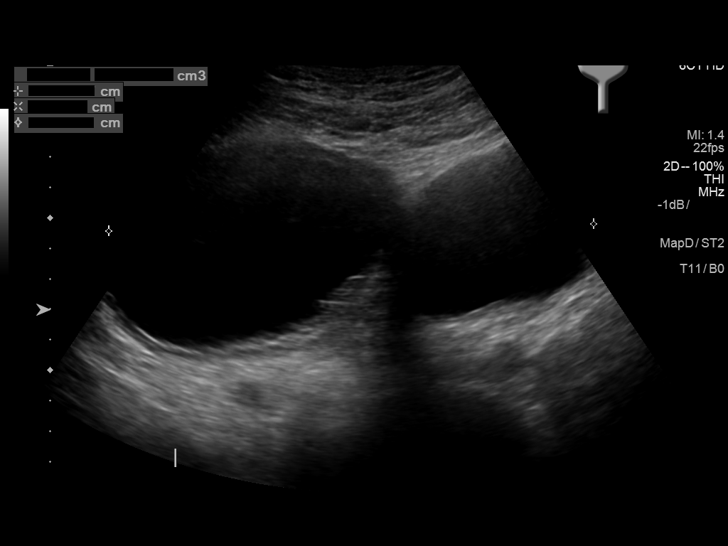

[14 of 25 positions shown; findings below may reference images not displayed]

FINDINGS: Right Kidney:

Renal measurements: 11.8 x 6.7 x 7.1 cm = volume: 295.9 mL. Normal
cortical thickness. Upper normal cortical echogenicity. No mass,
hydronephrosis or shadowing calcification.

Left Kidney:

Renal measurements: 11.5 x 6.5 x 5.9 cm = volume: 226.6 mL. No mass,
hydronephrosis, or shadowing calcification.

Bladder:

Distended, calculated prevoid volume 2672 mL with a large postvoid
residual 961 mL. BILATERAL ureteral jets visualized. Large LEFT and
small posterior RIGHT bladder diverticula identified. No definite
bladder mass.

Prostatic enlargement 5.1 x 7.1 x 4.7 cm = volume 90 mL.
IMPRESSION: Bladder diverticula larger on LEFT and small on RIGHT.

Prostatic enlargement.

No renal sonographic abnormalities identified.

## 2019-12-26 ENCOUNTER — Other Ambulatory Visit: Payer: Self-pay

## 2019-12-26 ENCOUNTER — Ambulatory Visit
Admission: RE | Admit: 2019-12-26 | Discharge: 2019-12-26 | Disposition: A | Discharge status: Home / Self Care | Payer: BLUE CROSS/BLUE SHIELD | Source: Ambulatory Visit | Attending: Internal Medicine | Admitting: Internal Medicine

## 2019-12-27 ENCOUNTER — Telehealth: Payer: Self-pay | Admitting: Internal Medicine

## 2019-12-27 NOTE — Telephone Encounter (Signed)
Copied from [place] [id number]. Topic: General - Inquiry >> Dec 27, 2019 12:15 PM Sheran Luz wrote: Patient would like to know if Dr. Army Melia would call him to discuss regen IV infusion. He states he is interested in setting this up and believes it is covered by his insurance.

## 2019-12-30 NOTE — Telephone Encounter (Signed)
Called and left patient a VM tell him the IV infusions are only for covid positive patients. We have no control over him being able to get this or not. Told him the patients who test positive for Covid have to qualify to get this infusion and then he will be contacted about it from the infusion center.  CM

## 2019-12-30 NOTE — Telephone Encounter (Signed)
Pt called in and just wanted dr to know he was Covid positive and he has the number to call directly,  He will contact them to see if he Qualifies for it.  He would also like to know if Dr wanted to call in some meds for him?

## 2019-12-31 NOTE — Telephone Encounter (Signed)
Called pt with message from [facility]

## 2020-02-09 ENCOUNTER — Other Ambulatory Visit: Payer: Self-pay | Admitting: Internal Medicine

## 2020-02-09 ENCOUNTER — Other Ambulatory Visit: Payer: Self-pay | Admitting: Physician Assistant

## 2020-02-09 DIAGNOSIS — E782 Mixed hyperlipidemia: Secondary | ICD-10-CM

## 2020-02-09 DIAGNOSIS — N401 Enlarged prostate with lower urinary tract symptoms: Secondary | ICD-10-CM

## 2020-02-09 NOTE — Telephone Encounter (Signed)
Requested Prescriptions  Pending Prescriptions Disp Refills   atorvastatin (LIPITOR) 10 MG tablet [Pharmacy Med Name: ATORVASTATIN 10 MG TABLET] 90 tablet 1    Sig: TAKE 1 TABLET BY MOUTH EVERY DAY     Cardiovascular:  Antilipid - Statins Failed - 02/09/2020  6:37 PM      Failed - Total Cholesterol in normal range and within 360 days    Cholesterol, Total  Date Value Ref Range Status  07/31/2019 223 (H) 100 - 199 mg/dL Final         Failed - LDL in normal range and within 360 days    LDL Chol Calc (NIH)  Date Value Ref Range Status  07/31/2019 133 (H) 0 - 99 mg/dL Final         Failed - Triglycerides in normal range and within 360 days    Triglycerides  Date Value Ref Range Status  07/31/2019 162 (H) 0 - 149 mg/dL Final         Passed - HDL in normal range and within 360 days    HDL  Date Value Ref Range Status  07/31/2019 61 >39 mg/dL Final         Passed - Patient is not pregnant      Passed - Valid encounter within last 12 months    Recent Outpatient Visits          6 months ago Annual physical exam   [facility] Glean Hess, MD   1 year ago Annual physical exam   [facility] Glean Hess, MD   2 years ago Annual physical exam   [facility] Glean Hess, MD   3 years ago Acute non-recurrent sinusitis, unspecified location   [facility] Glean Hess, MD   3 years ago Annual physical exam   [facility] Glean Hess, MD      Future Appointments            In 3 months Luana Shu [facility]   In 5 months Army Melia, Jesse Sans, MD [facility], [place]

## 2020-03-10 DIAGNOSIS — L821 Other seborrheic keratosis: Secondary | ICD-10-CM | POA: Diagnosis not present

## 2020-03-10 DIAGNOSIS — L281 Prurigo nodularis: Secondary | ICD-10-CM | POA: Diagnosis not present

## 2020-05-03 ENCOUNTER — Other Ambulatory Visit: Payer: Self-pay | Admitting: Internal Medicine

## 2020-05-03 DIAGNOSIS — F419 Anxiety disorder, unspecified: Secondary | ICD-10-CM

## 2020-05-03 NOTE — Telephone Encounter (Signed)
Requested Prescriptions  Pending Prescriptions Disp Refills  . PARoxetine (PAXIL-CR) 25 MG 24 hr tablet [Pharmacy Med Name: PAROXETINE ER 25 MG TABLET] 90 tablet 0    Sig: TAKE 1 TABLET BY MOUTH EVERY DAY     Psychiatry:  Antidepressants - SSRI Failed - 05/03/2020  9:30 AM      Failed - Valid encounter within last 6 months    Recent Outpatient Visits          9 months ago Annual physical exam   [facility] Glean Hess, MD   2 years ago Annual physical exam   [facility] Glean Hess, MD   3 years ago Annual physical exam   [facility] Glean Hess, MD   3 years ago Acute non-recurrent sinusitis, unspecified location   [facility] Glean Hess, MD   4 years ago Annual physical exam   [facility] Glean Hess, MD      Future Appointments            In 1 week Luana Shu [place]   In 3 months Army Melia, Jesse Sans, MD [facility], [place]

## 2020-05-04 ENCOUNTER — Other Ambulatory Visit: Payer: Self-pay | Admitting: *Deleted

## 2020-05-04 ENCOUNTER — Telehealth: Payer: Self-pay | Admitting: Physician Assistant

## 2020-05-04 DIAGNOSIS — N401 Enlarged prostate with lower urinary tract symptoms: Secondary | ICD-10-CM

## 2020-05-04 DIAGNOSIS — N138 Other obstructive and reflux uropathy: Secondary | ICD-10-CM

## 2020-05-04 MED ORDER — TAMSULOSIN HCL 0.4 MG PO CAPS: 0.4000 mg | ORAL_CAPSULE | Freq: Every day | ORAL | 0 refills | Status: DC

## 2020-05-04 MED ORDER — FINASTERIDE 5 MG PO TABS: 5.0000 mg | ORAL_TABLET | Freq: Every day | ORAL | 0 refills | Status: DC

## 2020-05-04 NOTE — Telephone Encounter (Signed)
Pt. States he is out of Medication and Pharmacy told him the Rx number he is providing is incorrect. He has upcoming appointment for 05/14/20

## 2020-05-14 ENCOUNTER — Other Ambulatory Visit: Payer: Self-pay

## 2020-05-14 ENCOUNTER — Ambulatory Visit (INDEPENDENT_AMBULATORY_CARE_PROVIDER_SITE_OTHER): Payer: BLUE CROSS/BLUE SHIELD | Admitting: Physician Assistant

## 2020-05-14 ENCOUNTER — Encounter: Payer: Self-pay | Admitting: Physician Assistant

## 2020-05-14 VITALS — BP 124/78 | HR 73 | Ht 72.0 in | Wt 221.0 lb

## 2020-05-14 DIAGNOSIS — N401 Enlarged prostate with lower urinary tract symptoms: Secondary | ICD-10-CM | POA: Diagnosis not present

## 2020-05-14 DIAGNOSIS — N138 Other obstructive and reflux uropathy: Secondary | ICD-10-CM

## 2020-05-14 DIAGNOSIS — R3129 Other microscopic hematuria: Secondary | ICD-10-CM

## 2020-05-14 LAB — URINALYSIS, COMPLETE
Bilirubin, UA: NEGATIVE
Glucose, UA: NEGATIVE
Ketones, UA: NEGATIVE
Leukocytes,UA: NEGATIVE
Nitrite, UA: NEGATIVE
Specific Gravity, UA: 1.02 (ref 1.005–1.030)
Urobilinogen, Ur: 0.2 mg/dL (ref 0.2–1.0)
pH, UA: 7 (ref 5.0–7.5)

## 2020-05-14 LAB — BLADDER SCAN AMB NON-IMAGING

## 2020-05-14 LAB — MICROSCOPIC EXAMINATION: Bacteria, UA: NONE SEEN

## 2020-05-14 MED ORDER — FINASTERIDE 5 MG PO TABS: 5.0000 mg | ORAL_TABLET | Freq: Every day | ORAL | 11 refills | Status: DC

## 2020-05-14 MED ORDER — TAMSULOSIN HCL 0.4 MG PO CAPS: 0.4000 mg | ORAL_CAPSULE | Freq: Two times a day (BID) | ORAL | 11 refills | Status: DC

## 2020-05-14 NOTE — Progress Notes (Signed)
05/14/2020 9:52 AM   Charles Turner 09/23/1960 [id number]  CC: Chief Complaint  Patient presents with  . Benign Prostatic Hypertrophy  . Follow-up   HPI: Charles Turner is a 59 y.o. male with PMH BPH with severe BOO on Flomax and finasteride, chronic urinary retention, bladder diverticulum, persistent microscopic hematuria, and ED on sildenafil who presents today for annual follow-up.  He has failed CIC and previously refused bladder outlet procedures and hematuria work-up.  Today he reports stable urinary symptoms over the last year.  He primarily is noting straining to urinate and weak stream despite Flomax and finasteride.  He is tolerating these medications well without orthostasis. IPSS 21/2 today, previously 23/4.  He reports he would be open to hematuria work-up at this time if recommended, however he remains disinterested in bladder outlet procedures.  PVR 746mL.   IPSS    Row Name 05/14/20 0900         International Prostate Symptom Score   How often have you had the sensation of not emptying your bladder? More than half the time     How often have you had to urinate less than every two hours? Less than half the time     How often have you found you stopped and started again several times when you urinated? More than half the time     How often have you found it difficult to postpone urination? Not at All     How often have you had a weak urinary stream? Almost always     How often have you had to strain to start urination? Almost always     How many times did you typically get up at night to urinate? 1 Time     Total IPSS Score 21           Quality of Life due to urinary symptoms   If you were to spend the rest of your life with your urinary condition just the way it is now how would you feel about that? Mostly Satisfied            PMH: Past Medical History:  Diagnosis Date  . Anxiety   . High cholesterol   . Prostate enlargement     Surgical  History: No past surgical history on file.  Home Medications:  Allergies as of 05/14/2020      Reactions   Bee Venom Anaphylaxis   Wasp Venom Anaphylaxis      Medication List       Accurate as of May 14, 2020  9:52 AM. If you have any questions, ask your nurse or doctor.        atorvastatin 10 MG tablet Commonly known as: LIPITOR TAKE 1 TABLET BY MOUTH EVERY DAY   EPINEPHrine 0.3 mg/0.3 mL Soaj injection Commonly known as: EPI-PEN INJECT 0.3 MLS (0.3 MG TOTAL) INTO THE MUSCLE ONCE FOR 1 DOSE.   finasteride 5 MG tablet Commonly known as: PROSCAR Take 1 tablet (5 mg total) by mouth daily.   PARoxetine 25 MG 24 hr tablet Commonly known as: PAXIL-CR TAKE 1 TABLET BY MOUTH EVERY DAY   sildenafil 100 MG tablet Commonly known as: Viagra Take 1 tablet (100 mg total) by mouth daily as needed.   tamsulosin 0.4 MG Caps capsule Commonly known as: FLOMAX Take 1 capsule (0.4 mg total) by mouth daily.       Allergies:  Allergies  Allergen Reactions  . Bee Venom Anaphylaxis  . Wasp Venom  Anaphylaxis    Family History: Family History  Adopted: Yes  Family history unknown: Yes    Social History:   reports that he has never smoked. He has quit using smokeless tobacco.  His smokeless tobacco use included chew. He reports that he does not drink alcohol and does not use drugs.  Physical Exam: BP 124/78 (BP Location: Left Arm, Patient Position: Sitting, Cuff Size: Normal)   Pulse 73   Ht 6' (1.829 m)   Wt 221 lb (100.2 kg)   BMI 29.97 kg/m   Constitutional:  Alert and oriented, no acute distress, nontoxic appearing HEENT: [place], AT Cardiovascular: No clubbing, cyanosis, or edema Respiratory: Normal respiratory effort, no increased work of breathing GU: Normal sphincter tone.  No external hemorrhoids.  Symmetrically enlarged, 40+ cc prostate without nodules or induration.  Exam limited to the apex and mid gland. Skin: No rashes, bruises or suspicious  lesions Neurologic: Grossly intact, no focal deficits, moving all 4 extremities Psychiatric: Normal mood and affect  Laboratory Data: Results for orders placed or performed in visit on 05/14/20  Bladder Scan (Post Void Residual) in office  Result Value Ref Range   Scan Result 710mL    Assessment & Plan:   1. Benign prostatic hyperplasia with urinary obstruction PVR and symptoms stable on dual pharmacotherapy.  I counseled him that he may note improvement in urinary straining on increased Flomax.  We will refill his medications for the year with plans for Flomax 0.4 mg twice daily.  We discussed bladder outlet procedures in clinic today including HOLEP versus TURP versus UroLift.  Patient continues to desire to defer these pending worsening in his urinary symptoms. - PSA - Bladder Scan (Post Void Residual) in office - tamsulosin (FLOMAX) 0.4 MG CAPS capsule; Take 1 capsule (0.4 mg total) by mouth 2 (two) times daily.  Dispense: 60 capsule; Refill: 11 - finasteride (PROSCAR) 5 MG tablet; Take 1 tablet (5 mg total) by mouth daily.  Dispense: 30 tablet; Refill: 11  2. Microscopic hematuria We previously deferred UA given patient opposition to hematuria work-up.  We discussed today that we cannot rule out malignant causes of hematuria in the absence of a hematuria work-up.  Ultimately, patient reports he would be willing to undergo hematuria work-up if necessary.  Obtained UA in clinic today, will contact patient with results. - Urinalysis, Complete   Return in about 1 year (around 05/14/2021) for Annual DRE/IPSS/PVR/UA with PSA prior.  Debroah Loop, PA-C  [facility] [address] [place], [place] [address] [phone]

## 2020-05-15 ENCOUNTER — Telehealth: Payer: Self-pay | Admitting: Physician Assistant

## 2020-05-15 DIAGNOSIS — R3129 Other microscopic hematuria: Secondary | ICD-10-CM

## 2020-05-15 LAB — PSA: Prostate Specific Ag, Serum: 2.1 ng/mL (ref 0.0–4.0)

## 2020-05-15 NOTE — Telephone Encounter (Signed)
[facility] notifying patient as advised. Asked patient to call back to schedule cysto. Will follow up with patient next week.

## 2020-05-15 NOTE — Telephone Encounter (Signed)
Please contact the patient and inform him that his PSA has gone down this year, now 2.1.  This is excellent news.  Additionally, his urinalysis from yesterday revealed the continued presence of blood in his urine.  At this point, I recommend a cystoscopy and renal ultrasound to evaluate him for sources of this blood.  I have placed an order for renal ultrasound and the imaging department will contact him to schedule this.  Additionally, please schedule him for cystoscopy with renal ultrasound results with Dr. Erlene Quan in approximately 1 month.

## 2020-06-01 NOTE — Telephone Encounter (Signed)
LMOM asking patient to please return call. Looking at patients appt desk, seems to be no RUS scheduled. Going to try to send patient a [employer].

## 2020-06-02 ENCOUNTER — Telehealth: Payer: Self-pay | Admitting: *Deleted

## 2020-06-02 NOTE — Telephone Encounter (Signed)
Talked with patient today , he states he wants to wait for 6 months and follow up for a office visit.  He states he doesn't want the u/s and cysto at this time.

## 2020-06-08 NOTE — Telephone Encounter (Signed)
Please confirm that he would like to follow-up with me in 6 months, sooner than his scheduled annual follow-up with me at the end of 2022.  Okay to schedule per patient preference

## 2020-07-25 ENCOUNTER — Other Ambulatory Visit: Payer: Self-pay | Admitting: Internal Medicine

## 2020-07-25 DIAGNOSIS — F419 Anxiety disorder, unspecified: Secondary | ICD-10-CM

## 2020-07-25 NOTE — Telephone Encounter (Signed)
Requested Prescriptions  Pending Prescriptions Disp Refills  . PARoxetine (PAXIL-CR) 25 MG 24 hr tablet [Pharmacy Med Name: PAROXETINE ER 25 MG TABLET] 10 tablet 0    Sig: TAKE 1 TABLET BY MOUTH EVERY DAY     Psychiatry:  Antidepressants - SSRI Failed - 07/25/2020  8:58 AM      Failed - Valid encounter within last 6 months    Recent Outpatient Visits          12 months ago Annual physical exam   [facility] Glean Hess, MD   2 years ago Annual physical exam   [facility] Glean Hess, MD   3 years ago Annual physical exam   [facility] Glean Hess, MD   3 years ago Acute non-recurrent sinusitis, unspecified location   [facility] Glean Hess, MD   4 years ago Annual physical exam   [facility] Glean Hess, MD      Future Appointments            In 1 week Army Melia Jesse Sans, MD [facility], [place]   In 9 months Debroah Loop, [facility]

## 2020-08-02 ENCOUNTER — Other Ambulatory Visit: Payer: Self-pay | Admitting: Internal Medicine

## 2020-08-02 DIAGNOSIS — E782 Mixed hyperlipidemia: Secondary | ICD-10-CM

## 2020-08-02 NOTE — Telephone Encounter (Signed)
Requested Prescriptions  Pending Prescriptions Disp Refills  . atorvastatin (LIPITOR) 10 MG tablet [Pharmacy Med Name: ATORVASTATIN 10 MG TABLET] 90 tablet 0    Sig: TAKE 1 TABLET BY MOUTH EVERY DAY     Cardiovascular:  Antilipid - Statins Failed - 08/02/2020  9:19 AM      Failed - Total Cholesterol in normal range and within 360 days    Cholesterol, Total  Date Value Ref Range Status  07/31/2019 223 (H) 100 - 199 mg/dL Final         Failed - LDL in normal range and within 360 days    LDL Chol Calc (NIH)  Date Value Ref Range Status  07/31/2019 133 (H) 0 - 99 mg/dL Final         Failed - HDL in normal range and within 360 days    HDL  Date Value Ref Range Status  07/31/2019 61 >39 mg/dL Final         Failed - Triglycerides in normal range and within 360 days    Triglycerides  Date Value Ref Range Status  07/31/2019 162 (H) 0 - 149 mg/dL Final         Failed - Valid encounter within last 12 months    Recent Outpatient Visits          1 year ago Annual physical exam   [facility] Glean Hess, MD   2 years ago Annual physical exam   [facility] Glean Hess, MD   3 years ago Annual physical exam   [facility] Glean Hess, MD   3 years ago Acute non-recurrent sinusitis, unspecified location   [facility] Glean Hess, MD   4 years ago Annual physical exam   [facility] Glean Hess, MD      Future Appointments            Tomorrow Glean Hess, MD [facility], [place]   In 9 months [place], [place], [place] - Patient is not pregnant

## 2020-08-03 ENCOUNTER — Other Ambulatory Visit: Payer: Self-pay

## 2020-08-03 ENCOUNTER — Encounter: Payer: Self-pay | Admitting: Internal Medicine

## 2020-08-03 ENCOUNTER — Ambulatory Visit (INDEPENDENT_AMBULATORY_CARE_PROVIDER_SITE_OTHER): Payer: BC Managed Care – PPO | Admitting: Internal Medicine

## 2020-08-03 VITALS — BP 136/82 | HR 76 | Temp 98.1°F | Ht 72.0 in | Wt 225.0 lb

## 2020-08-03 DIAGNOSIS — R338 Other retention of urine: Secondary | ICD-10-CM

## 2020-08-03 DIAGNOSIS — Z1211 Encounter for screening for malignant neoplasm of colon: Secondary | ICD-10-CM | POA: Diagnosis not present

## 2020-08-03 DIAGNOSIS — Z0000 Encounter for general adult medical examination without abnormal findings: Secondary | ICD-10-CM | POA: Diagnosis not present

## 2020-08-03 DIAGNOSIS — N401 Enlarged prostate with lower urinary tract symptoms: Secondary | ICD-10-CM

## 2020-08-03 DIAGNOSIS — F411 Generalized anxiety disorder: Secondary | ICD-10-CM

## 2020-08-03 DIAGNOSIS — E782 Mixed hyperlipidemia: Secondary | ICD-10-CM | POA: Diagnosis not present

## 2020-08-03 MED ORDER — PAROXETINE HCL ER 25 MG PO TB24: 25.0000 mg | ORAL_TABLET | Freq: Every day | ORAL | 1 refills | Status: DC

## 2020-08-03 NOTE — Progress Notes (Signed)
Date:  08/03/2020   Name:  Charles Turner   DOB:  08-30-1960   MRN:  [id number]   Chief Complaint: Annual Exam (Pt stated if his cholesterol is good then he would like to stop taking atorvastatin)  Charles Turner is a 60 y.o. male who presents today for his Complete Annual Exam. He feels well. He reports exercising gym X3 days a week. He reports he is sleeping well.   Colonoscopy: Cologuard 08/2019 negative  Immunization History  Administered Date(s) Administered  . PFIZER(Purple Top)SARS-COV-2 Vaccination 09/19/2019, 10/15/2019    Hyperlipidemia This is a chronic problem. The problem is controlled. Pertinent negatives include no chest pain, focal weakness, myalgias or shortness of breath. Current antihyperlipidemic treatment includes statins. The current treatment provides significant improvement of lipids.  Anxiety Presents for follow-up visit. Patient reports no chest pain, dizziness, palpitations or shortness of breath. Symptoms occur occasionally.   Compliance with medications is 76-100%.  Microscopic Hematuria - he has seen Urology. Renal US done last year.  He has decided not to repeat the [place] and wants to delay the recommended cystoscopy for now. BPH - he is being followed for BPH with urinary retention.  He is on flomax and proscar.  He does not have significant discomfort.   Lab Results  Component Value Date   CREATININE 0.99 07/31/2019   BUN 18 07/31/2019   NA 140 07/31/2019   K 4.2 07/31/2019   CL 101 07/31/2019   CO2 23 07/31/2019   Lab Results  Component Value Date   CHOL 223 (H) 07/31/2019   HDL 61 07/31/2019   LDLCALC 133 (H) 07/31/2019   TRIG 162 (H) 07/31/2019   CHOLHDL 3.7 07/31/2019   Lab Results  Component Value Date   TSH 3.350 03/16/2017   No results found for: HGBA1C Lab Results  Component Value Date   WBC 6.6 07/31/2019   HGB 15.3 07/31/2019   HCT 48.0 07/31/2019   MCV 91 07/31/2019   PLT 306 07/31/2019   Lab Results  Component  Value Date   ALT 27 07/31/2019   AST 25 07/31/2019   ALKPHOS 59 07/31/2019   BILITOT 0.4 07/31/2019   Lab Results  Component Value Date   PSA1 2.1 05/14/2020   PSA1 3.3 05/15/2019   PSA1 3.1 03/19/2018     Review of Systems  Constitutional: Negative for appetite change, chills, diaphoresis, fatigue and unexpected weight change.  HENT: Negative for hearing loss, tinnitus, trouble swallowing and voice change.   Eyes: Negative for visual disturbance.  Respiratory: Negative for choking, shortness of breath and wheezing.   Cardiovascular: Negative for chest pain, palpitations and leg swelling.  Gastrointestinal: Negative for abdominal pain, blood in stool, constipation and diarrhea.  Genitourinary: Positive for frequency and urgency. Negative for difficulty urinating, dysuria and hematuria.       Chronic high PVR but minimal sx  Musculoskeletal: Positive for arthralgias (hip pain). Negative for back pain and myalgias.  Skin: Negative for color change and rash.  Neurological: Negative for dizziness, focal weakness, syncope and headaches.  Hematological: Negative for adenopathy.  Psychiatric/Behavioral: Negative for dysphoric mood and sleep disturbance.    Patient Active Problem List   Diagnosis Date Noted  . Primary osteoarthritis of both knees 03/15/2016  . Urinary retention due to benign prostatic hyperplasia 12/22/2015  . Generalized anxiety disorder 06/17/2015  . Bee sting allergy 06/17/2015  . Mixed hyperlipidemia 05/05/2015  . Bladder neoplasm 05/05/2015  . Abnormal LFTs 05/05/2015  . Failure of  erection 05/05/2015  . Eunuchoidism 05/05/2015    Allergies  Allergen Reactions  . Bee Venom Anaphylaxis  . Wasp Venom Anaphylaxis    History reviewed. No pertinent surgical history.  Social History   Tobacco Use  . Smoking status: Never Smoker  . Smokeless tobacco: Former Systems developer    Types: Chew  Substance Use Topics  . Alcohol use: No    Alcohol/week: 0.0 standard  drinks  . Drug use: No     Medication list has been reviewed and updated.  Current Meds  Medication Sig  . atorvastatin (LIPITOR) 10 MG tablet TAKE 1 TABLET BY MOUTH EVERY DAY  . Chlorpheniramine Maleate (ALLERGY PO) Take by mouth as needed.  Marland Kitchen EPINEPHrine 0.3 mg/0.3 mL IJ SOAJ injection INJECT 0.3 MLS (0.3 MG TOTAL) INTO THE MUSCLE ONCE FOR 1 DOSE.  . finasteride (PROSCAR) 5 MG tablet Take 1 tablet (5 mg total) by mouth daily.  . Multiple Vitamin (MULTI VITAMIN PO) Take by mouth daily.  Marland Kitchen PARoxetine (PAXIL-CR) 25 MG 24 hr tablet TAKE 1 TABLET BY MOUTH EVERY DAY  . sildenafil (VIAGRA) 100 MG tablet Take 1 tablet (100 mg total) by mouth daily as needed.  . tamsulosin (FLOMAX) 0.4 MG CAPS capsule Take 1 capsule (0.4 mg total) by mouth 2 (two) times daily. (Patient taking differently: Take 0.8 mg by mouth daily.)  . Turmeric (QC TUMERIC COMPLEX PO) Take by mouth daily.    PHQ 2/9 Scores 08/03/2020 07/31/2019 03/19/2018 03/16/2017  PHQ - 2 Score 0 0 0 0  PHQ- 9 Score 0 1 - -    GAD 7 : Generalized Anxiety Score 08/03/2020  Nervous, Anxious, on Edge 0  Control/stop worrying 0  Worry too much - different things 0  Trouble relaxing 0  Restless 0  Easily annoyed or irritable 0  Afraid - awful might happen 0  Total GAD 7 Score 0    BP Readings from Last 3 Encounters:  08/03/20 136/82  05/14/20 124/78  07/31/19 130/80    Physical Exam Vitals and nursing note reviewed.  Constitutional:      Appearance: Normal appearance. He is well-developed.  HENT:     Head: Normocephalic.     Right Ear: Tympanic membrane, ear canal and external ear normal.     Left Ear: Tympanic membrane, ear canal and external ear normal.     Nose: Nose normal.  Eyes:     Conjunctiva/sclera: Conjunctivae normal.     Pupils: Pupils are equal, round, and reactive to light.  Neck:     Thyroid: No thyromegaly.     Vascular: No carotid bruit.  Cardiovascular:     Rate and Rhythm: Normal rate and regular rhythm.      Heart sounds: Normal heart sounds.  Pulmonary:     Effort: Pulmonary effort is normal.     Breath sounds: Normal breath sounds. No wheezing.  Chest:  Breasts:     Right: No mass.     Left: No mass.    Abdominal:     General: Bowel sounds are normal.     Palpations: Abdomen is soft.     Tenderness: There is no abdominal tenderness.  Musculoskeletal:        General: Normal range of motion.     Cervical back: Normal range of motion and neck supple.  Lymphadenopathy:     Cervical: No cervical adenopathy.  Skin:    General: Skin is warm and dry.  Neurological:     Mental Status: He is  alert and oriented to person, place, and time.     Deep Tendon Reflexes: Reflexes are normal and symmetric.  Psychiatric:        Attention and Perception: Attention normal.        Mood and Affect: Mood normal.        Thought Content: Thought content normal.     Wt Readings from Last 3 Encounters:  08/03/20 225 lb (102.1 kg)  05/14/20 221 lb (100.2 kg)  07/31/19 219 lb (99.3 kg)    BP 136/82   Pulse 76   Temp 98.1 F (36.7 C) (Oral)   Ht 6' (1.829 m)   Wt 225 lb (102.1 kg)   SpO2 95%   BMI 30.52 kg/m   Assessment and Plan: 1. Annual physical exam Normal exam except mild weight gain Continue exercise, healthy diet  2. Colon cancer screening Cologuard negative last year Repeat in 2023  3. Mixed hyperlipidemia Tolerating statin medication without side effects at this time LDL is at goal of < 70 on current dose Continue same therapy without change at this time.  Discussed stopping medication and the resultant rise in lipids that is expected. He has decided to continue Lipitor for now.  4. Generalized anxiety disorder Doing well Paxil daily.  5. Urinary retention due to benign prostatic hyperplasia Continue current medication - follow up with Urology   Partially dictated using [place]. Any errors are unintentional.  Halina Maidens, MD [facility]  08/03/2020

## 2020-08-04 LAB — CBC WITH DIFFERENTIAL/PLATELET
Basophils Absolute: 0 10*3/uL (ref 0.0–0.2)
Basos: 1 %
EOS (ABSOLUTE): 0.1 10*3/uL (ref 0.0–0.4)
Eos: 1 %
Hematocrit: 42.5 % (ref 37.5–51.0)
Hemoglobin: 14.2 g/dL (ref 13.0–17.7)
Immature Grans (Abs): 0 10*3/uL (ref 0.0–0.1)
Immature Granulocytes: 0 %
Lymphocytes Absolute: 2.4 10*3/uL (ref 0.7–3.1)
Lymphs: 41 %
MCH: 30.3 pg (ref 26.6–33.0)
MCHC: 33.4 g/dL (ref 31.5–35.7)
MCV: 91 fL (ref 79–97)
Monocytes Absolute: 0.5 10*3/uL (ref 0.1–0.9)
Monocytes: 8 %
Neutrophils Absolute: 2.9 10*3/uL (ref 1.4–7.0)
Neutrophils: 49 %
Platelets: 284 10*3/uL (ref 150–450)
RBC: 4.68 x10E6/uL (ref 4.14–5.80)
RDW: 13.1 % (ref 11.6–15.4)
WBC: 5.8 10*3/uL (ref 3.4–10.8)

## 2020-08-04 LAB — COMPREHENSIVE METABOLIC PANEL
ALT: 29 IU/L (ref 0–44)
AST: 29 IU/L (ref 0–40)
Albumin/Globulin Ratio: 1.8 (ref 1.2–2.2)
Albumin: 4.4 g/dL (ref 3.8–4.9)
Alkaline Phosphatase: 57 IU/L (ref 44–121)
BUN/Creatinine Ratio: 15 (ref 9–20)
BUN: 16 mg/dL (ref 6–24)
Bilirubin Total: 0.5 mg/dL (ref 0.0–1.2)
CO2: 25 mmol/L (ref 20–29)
Calcium: 9.6 mg/dL (ref 8.7–10.2)
Chloride: 102 mmol/L (ref 96–106)
Creatinine, Ser: 1.04 mg/dL (ref 0.76–1.27)
Globulin, Total: 2.5 g/dL (ref 1.5–4.5)
Glucose: 89 mg/dL (ref 65–99)
Potassium: 4.4 mmol/L (ref 3.5–5.2)
Sodium: 140 mmol/L (ref 134–144)
Total Protein: 6.9 g/dL (ref 6.0–8.5)
eGFR: 83 mL/min/{1.73_m2} (ref >59)

## 2020-08-04 LAB — LIPID PANEL
Chol/HDL Ratio: 3.8 ratio (ref 0.0–5.0)
Cholesterol, Total: 223 mg/dL — ABNORMAL HIGH (ref 100–199)
HDL: 59 mg/dL (ref >39)
LDL Chol Calc (NIH): 134 mg/dL — ABNORMAL HIGH (ref 0–99)
Triglycerides: 169 mg/dL — ABNORMAL HIGH (ref 0–149)
VLDL Cholesterol Cal: 30 mg/dL (ref 5–40)

## 2020-08-23 ENCOUNTER — Other Ambulatory Visit: Payer: Self-pay | Admitting: Internal Medicine

## 2020-08-23 DIAGNOSIS — E782 Mixed hyperlipidemia: Secondary | ICD-10-CM

## 2020-08-23 NOTE — Telephone Encounter (Signed)
Last ordered on 08/02/20 #90 tabs. This request is not needed at this time. Refusing request.

## 2020-11-27 ENCOUNTER — Other Ambulatory Visit: Payer: Self-pay | Admitting: Internal Medicine

## 2020-11-27 DIAGNOSIS — F411 Generalized anxiety disorder: Secondary | ICD-10-CM

## 2020-11-27 DIAGNOSIS — E782 Mixed hyperlipidemia: Secondary | ICD-10-CM

## 2020-12-03 ENCOUNTER — Ambulatory Visit (INDEPENDENT_AMBULATORY_CARE_PROVIDER_SITE_OTHER): Payer: BC Managed Care – PPO | Admitting: Physician Assistant

## 2020-12-03 ENCOUNTER — Encounter: Payer: Self-pay | Admitting: Physician Assistant

## 2020-12-03 ENCOUNTER — Other Ambulatory Visit: Payer: Self-pay

## 2020-12-03 VITALS — BP 120/77 | HR 75 | Ht 72.0 in | Wt 222.0 lb

## 2020-12-03 DIAGNOSIS — N529 Male erectile dysfunction, unspecified: Secondary | ICD-10-CM | POA: Diagnosis not present

## 2020-12-03 DIAGNOSIS — N138 Other obstructive and reflux uropathy: Secondary | ICD-10-CM

## 2020-12-03 DIAGNOSIS — N401 Enlarged prostate with lower urinary tract symptoms: Secondary | ICD-10-CM

## 2020-12-03 DIAGNOSIS — R3129 Other microscopic hematuria: Secondary | ICD-10-CM

## 2020-12-03 MED ORDER — TADALAFIL 5 MG PO TABS: 5.0000 mg | ORAL_TABLET | Freq: Every day | ORAL | 11 refills | Status: DC

## 2020-12-03 MED ORDER — TADALAFIL 20 MG PO TABS: 20.0000 mg | ORAL_TABLET | Freq: Every day | ORAL | 2 refills | Status: DC | PRN

## 2020-12-03 MED ORDER — SILODOSIN 8 MG PO CAPS: 8.0000 mg | ORAL_CAPSULE | Freq: Every day | ORAL | 11 refills | Status: DC

## 2020-12-04 NOTE — Progress Notes (Signed)
12/03/2020 4:03 PM   Charles Turner 1960-09-15 [id number]  CC: Chief Complaint  Patient presents with   Benign Prostatic Hypertrophy   Medication Management   HPI: Charles Turner is a 60 y.o. male with PMH BPH with severe BOE on Flomax and finasteride, chronic urinary retention, bladder diverticulum, persistent microscopic hematuria, and ED on sildenafil who presents today to discuss finasteride.  He has failed CIC and has refused bladder outlet procedures and hematuria work-up.  Today he reports he read an article in the newspaper recently regarding the side effects of long-term use of finasteride including erectile dysfunction and mood disorders.  He also found a website regarding "post finasteride syndrome" and wishes to discuss the veracity of these claims.  He states there are class action lawsuits regarding finasteride use and he wonders if there are any alternative medications he could try for his chronic urinary retention.  He states he stopped finasteride approximately 2 weeks ago and restarted it 3 days ago based on his research.  While he was off the medication, he experienced jitters, anxiety, and sleep disturbance that he feels are due to abrupt discontinuation of finasteride.  He also reports some orthostasis over this time.  His symptoms have improved since resuming the medication.  He reports ongoing erectile dysfunction for which he has sildenafil, but states he only takes this on special occasions.  He states he also has a history of hypogonadism and was previously on supplemental testosterone.  He continues to decline hematuria work-up.  Additionally, he wonders if he could pursue a bladder outlet procedure without pursuing urodynamics testing first.  He is also curious if he would be a candidate for UroLift.  PMH: Past Medical History:  Diagnosis Date   Anxiety    High cholesterol    Prostate enlargement     Surgical History: No past surgical history on  file.  Home Medications:  Allergies as of 12/03/2020       Reactions   Bee Venom Anaphylaxis   Wasp Venom Anaphylaxis        Medication List        Accurate as of December 03, 2020 11:59 PM. If you have any questions, ask your nurse or doctor.          STOP taking these medications    sildenafil 100 MG tablet Commonly known as: Viagra Stopped by: Debroah Loop, PA-C   tamsulosin 0.4 MG Caps capsule Commonly known as: FLOMAX Stopped by: Debroah Loop, PA-C       TAKE these medications    ALLERGY PO Take by mouth as needed.   atorvastatin 10 MG tablet Commonly known as: LIPITOR TAKE 1 TABLET BY MOUTH EVERY DAY   EPINEPHrine 0.3 mg/0.3 mL Soaj injection Commonly known as: EPI-PEN INJECT 0.3 MLS (0.3 MG TOTAL) INTO THE MUSCLE ONCE FOR 1 DOSE.   finasteride 5 MG tablet Commonly known as: PROSCAR Take 1 tablet (5 mg total) by mouth daily.   MULTI VITAMIN PO Take by mouth daily.   PARoxetine 25 MG 24 hr tablet Commonly known as: PAXIL-CR TAKE 1 TABLET (25 MG TOTAL) BY MOUTH DAILY.   QC TUMERIC COMPLEX PO Take by mouth daily.   silodosin 8 MG Caps capsule Commonly known as: RAPAFLO Take 1 capsule (8 mg total) by mouth daily with breakfast. Started by: Debroah Loop, PA-C   tadalafil 5 MG tablet Commonly known as: CIALIS Take 1 tablet (5 mg total) by mouth daily. Started by: Debroah Loop, PA-C  tadalafil 20 MG tablet Commonly known as: CIALIS Take 1 tablet (20 mg total) by mouth daily as needed for erectile dysfunction. Started by: Debroah Loop, PA-C        Allergies:  Allergies  Allergen Reactions   Bee Venom Anaphylaxis   Wasp Venom Anaphylaxis    Family History: Family History  Adopted: Yes  Family history unknown: Yes    Social History:   reports that he has never smoked. He has quit using smokeless tobacco.  His smokeless tobacco use included chew. He reports that he does not drink alcohol  and does not use drugs.  Physical Exam: BP 120/77   Pulse 75   Ht 6' (1.829 m)   Wt 222 lb (100.7 kg)   BMI 30.11 kg/m   Constitutional:  Alert and oriented, no acute distress, nontoxic appearing HEENT: [place], AT Cardiovascular: No clubbing, cyanosis, or edema Respiratory: Normal respiratory effort, no increased work of breathing Skin: No rashes, bruises or suspicious lesions Neurologic: Grossly intact, no focal deficits, moving all 4 extremities Psychiatric: Normal mood and affect  Assessment & Plan:   1. Benign prostatic hyperplasia with urinary obstruction We discussed that erectile dysfunction and decreased libido are common side effects of finasteride, as finasteride blocks a form of testosterone in the body which causes the prostate to shrink.  We discussed that given his severe bladder outlet obstruction, it is important that he remains on finasteride unless he pursues surgical intervention for this surgical problem.  We had a lengthy conversation today regarding bladder outlet procedures.  I explained that he would be a poor candidate for UroLift given the size of his gland.  We discussed that HOLEP as an outpatient alternative which would relieve his bladder outlet obstruction and that if he pursued this procedure, he could stop all of his BPH medications.  Despite this, patient continues to decline bladder outlet procedures.  I additionally informed the patient that given the chronicity of his BOO, he would need to pursue urodynamics testing prior to pursuing any surgical intervention to ensure that his bladder is functioning appropriately.  I expressed my sincere concerns that if he delays surgical intervention for too long, he will develop a hypofunctioning bladder and no longer be a surgical candidate, relying on chronic indwelling Foley catheter to drain his bladder.  We also discussed the risks of upper tract involvement leading to acute renal injury, renal failure, and  death.  Given his concurrent ED, will also start him on low-dose daily Cialis for treatment of BPH and ED.  Also given his recent reports of orthostasis, I offered him a trial of silodosin as an alternative to tamsulosin to decrease his risk for the side effect. - tadalafil (CIALIS) 5 MG tablet; Take 1 tablet (5 mg total) by mouth daily.  Dispense: 30 tablet; Refill: 11 - silodosin (RAPAFLO) 8 MG CAPS capsule; Take 1 capsule (8 mg total) by mouth daily with breakfast.  Dispense: 30 capsule; Refill: 11  2. Erectile dysfunction, unspecified erectile dysfunction type Starting low-dose daily Cialis as above.  Also prescribing 20 mg tablets for as needed use with sexual activity. - tadalafil (CIALIS) 20 MG tablet; Take 1 tablet (20 mg total) by mouth daily as needed for erectile dysfunction.  Dispense: 30 tablet; Refill: 2  3. Microscopic hematuria Patient continues to decline hematuria work-up.  Return if symptoms worsen or fail to improve.  Debroah Loop, PA-C  [facility] [address] [place], [place] [address] [phone]

## 2020-12-10 DIAGNOSIS — T63481A Toxic effect of venom of other arthropod, accidental (unintentional), initial encounter: Secondary | ICD-10-CM | POA: Diagnosis not present

## 2020-12-10 DIAGNOSIS — T782XXA Anaphylactic shock, unspecified, initial encounter: Secondary | ICD-10-CM | POA: Diagnosis not present

## 2020-12-23 ENCOUNTER — Ambulatory Visit: Payer: Self-pay | Admitting: Urology

## 2021-02-22 ENCOUNTER — Other Ambulatory Visit: Payer: Self-pay | Admitting: Internal Medicine

## 2021-02-22 DIAGNOSIS — E782 Mixed hyperlipidemia: Secondary | ICD-10-CM

## 2021-02-28 ENCOUNTER — Other Ambulatory Visit: Payer: Self-pay | Admitting: Internal Medicine

## 2021-02-28 DIAGNOSIS — E782 Mixed hyperlipidemia: Secondary | ICD-10-CM

## 2021-02-28 NOTE — Telephone Encounter (Signed)
Last RF 02/22/21 #90 (too soon)

## 2021-04-07 DIAGNOSIS — D485 Neoplasm of uncertain behavior of skin: Secondary | ICD-10-CM | POA: Diagnosis not present

## 2021-04-07 DIAGNOSIS — L281 Prurigo nodularis: Secondary | ICD-10-CM | POA: Diagnosis not present

## 2021-05-10 ENCOUNTER — Other Ambulatory Visit: Payer: Self-pay

## 2021-05-10 DIAGNOSIS — N401 Enlarged prostate with lower urinary tract symptoms: Secondary | ICD-10-CM

## 2021-05-10 DIAGNOSIS — N138 Other obstructive and reflux uropathy: Secondary | ICD-10-CM

## 2021-05-11 ENCOUNTER — Other Ambulatory Visit: Payer: Self-pay

## 2021-05-11 ENCOUNTER — Other Ambulatory Visit: Payer: BC Managed Care – PPO

## 2021-05-11 DIAGNOSIS — N401 Enlarged prostate with lower urinary tract symptoms: Secondary | ICD-10-CM | POA: Diagnosis not present

## 2021-05-11 DIAGNOSIS — N138 Other obstructive and reflux uropathy: Secondary | ICD-10-CM | POA: Diagnosis not present

## 2021-05-13 LAB — PSA: Prostate Specific Ag, Serum: 1.9 ng/mL (ref 0.0–4.0)

## 2021-05-14 ENCOUNTER — Ambulatory Visit (INDEPENDENT_AMBULATORY_CARE_PROVIDER_SITE_OTHER): Payer: BC Managed Care – PPO | Admitting: Physician Assistant

## 2021-05-14 ENCOUNTER — Other Ambulatory Visit: Payer: Self-pay

## 2021-05-14 ENCOUNTER — Encounter: Payer: Self-pay | Admitting: Physician Assistant

## 2021-05-14 VITALS — BP 122/91 | HR 78 | Ht 72.0 in | Wt 225.0 lb

## 2021-05-14 DIAGNOSIS — N401 Enlarged prostate with lower urinary tract symptoms: Secondary | ICD-10-CM

## 2021-05-14 DIAGNOSIS — R3129 Other microscopic hematuria: Secondary | ICD-10-CM | POA: Diagnosis not present

## 2021-05-14 DIAGNOSIS — N529 Male erectile dysfunction, unspecified: Secondary | ICD-10-CM | POA: Diagnosis not present

## 2021-05-14 DIAGNOSIS — N138 Other obstructive and reflux uropathy: Secondary | ICD-10-CM

## 2021-05-14 LAB — URINALYSIS, COMPLETE
Bilirubin, UA: NEGATIVE
Glucose, UA: NEGATIVE
Ketones, UA: NEGATIVE
Leukocytes,UA: NEGATIVE
Nitrite, UA: NEGATIVE
Protein,UA: NEGATIVE
Specific Gravity, UA: 1.015 (ref 1.005–1.030)
Urobilinogen, Ur: 0.2 mg/dL (ref 0.2–1.0)
pH, UA: 6 (ref 5.0–7.5)

## 2021-05-14 LAB — MICROSCOPIC EXAMINATION
Bacteria, UA: NONE SEEN
Epithelial Cells (non renal): NONE SEEN /hpf (ref 0–10)

## 2021-05-14 LAB — BLADDER SCAN AMB NON-IMAGING

## 2021-05-14 MED ORDER — FINASTERIDE 5 MG PO TABS: 5.0000 mg | ORAL_TABLET | Freq: Every day | ORAL | 11 refills | Status: DC

## 2021-05-14 MED ORDER — TADALAFIL 5 MG PO TABS: 5.0000 mg | ORAL_TABLET | Freq: Every day | ORAL | 11 refills | Status: DC

## 2021-05-14 MED ORDER — TADALAFIL 20 MG PO TABS: 20.0000 mg | ORAL_TABLET | Freq: Every day | ORAL | 2 refills | Status: DC | PRN

## 2021-05-14 MED ORDER — SILODOSIN 8 MG PO CAPS: 8.0000 mg | ORAL_CAPSULE | Freq: Every day | ORAL | 11 refills | Status: DC

## 2021-05-14 NOTE — Progress Notes (Signed)
05/14/2021 9:49 AM   Charles Turner [birth year] [id number]  CC: Chief Complaint  Patient presents with   Benign Prostatic Hypertrophy   HPI: Charles Turner is a 60 y.o. male with PMH BPH with severe BOO who has declined outlet procedures, chronic urinary retention, bladder diverticulum, persistent microscopic hematuria who has declined hematuria workup, and ED who presents today for annual follow-up.   I saw him in clinic most recently on 12/03/2020 to discuss his concerns regarding endocrine side effects of finasteride. At that visit I switched him from Flomax to Rapaflo due to concerns for orthostasis, switched him from sildenafil to both daily and demand tadalafil for BPH and ED, and urged him to continue finasteride until he decides to pursue an outlet procedure.  Today he reports stable to perhaps slightly improved LUTS and ED on his adjusted regimen as above. His dizziness has resolved. IPSS 16/mostly satisfied as below, previously 23/mostly satisfied. PSA 1.9 on 05/11/2021, previously 2.1 on 05/14/2020.  In-office UA today positive for 1+ blood; urine microscopy with 11-30 RBCs/HPF. PVR 547mL.   IPSS     Row Name 05/14/21 0900         International Prostate Symptom Score   How often have you had the sensation of not emptying your bladder? Less than 1 in 5     How often have you had to urinate less than every two hours? Not at All     How often have you found you stopped and started again several times when you urinated? More than half the time     How often have you found it difficult to postpone urination? Not at All     How often have you had a weak urinary stream? Almost always     How often have you had to strain to start urination? Almost always     How many times did you typically get up at night to urinate? 1 Time     Total IPSS Score 16       Quality of Life due to urinary symptoms   If you were to spend the rest of your life with your urinary condition just  the way it is now how would you feel about that? Mostly Satisfied               PMH: Past Medical History:  Diagnosis Date   Anxiety    High cholesterol    Prostate enlargement     Surgical History: No past surgical history on file.  Home Medications:  Allergies as of 05/14/2021       Reactions   Bee Venom Anaphylaxis   Wasp Venom Anaphylaxis        Medication List        Accurate as of May 14, 2021  9:49 AM. If you have any questions, ask your nurse or doctor.          ALLERGY PO Take by mouth as needed.   atorvastatin 10 MG tablet Commonly known as: LIPITOR TAKE 1 TABLET BY MOUTH EVERY DAY   EPINEPHrine 0.3 mg/0.3 mL Soaj injection Commonly known as: EPI-PEN INJECT 0.3 MLS (0.3 MG TOTAL) INTO THE MUSCLE ONCE FOR 1 DOSE.   finasteride 5 MG tablet Commonly known as: PROSCAR Take 1 tablet (5 mg total) by mouth daily.   MULTI VITAMIN PO Take by mouth daily.   PARoxetine 25 MG 24 hr tablet Commonly known as: PAXIL-CR TAKE 1 TABLET (25 MG TOTAL) BY MOUTH DAILY.  QC TUMERIC COMPLEX PO Take by mouth daily.   silodosin 8 MG Caps capsule Commonly known as: RAPAFLO Take 1 capsule (8 mg total) by mouth daily with breakfast.   tadalafil 5 MG tablet Commonly known as: CIALIS Take 1 tablet (5 mg total) by mouth daily.   tadalafil 20 MG tablet Commonly known as: CIALIS Take 1 tablet (20 mg total) by mouth daily as needed for erectile dysfunction.        Allergies:  Allergies  Allergen Reactions   Bee Venom Anaphylaxis   Wasp Venom Anaphylaxis    Family History: Family History  Adopted: Yes  Family history unknown: Yes    Social History:   reports that he has never smoked. He has quit using smokeless tobacco.  His smokeless tobacco use included chew. He reports that he does not drink alcohol and does not use drugs.  Physical Exam: BP (!) 122/91    Pulse 78    Ht 6' (1.829 m)    Wt 225 lb (102.1 kg)    BMI 30.52 kg/m    Constitutional:  Alert and oriented, no acute distress, nontoxic appearing HEENT: [place], AT Cardiovascular: No clubbing, cyanosis, or edema Respiratory: Normal respiratory effort, no increased work of breathing GU: Normal sphincter tone, no external hemorrhoids. Smooth, symmetrically enlarged 50+cc prostate without nodules or induration. Skin: No rashes, bruises or suspicious lesions Neurologic: Grossly intact, no focal deficits, moving all 4 extremities Psychiatric: Normal mood and affect  Laboratory Data: Results for orders placed or performed in visit on 05/14/21  Microscopic Examination   Urine  Result Value Ref Range   WBC, UA 0-5 0 - 5 /hpf   RBC 11-30 (A) 0 - 2 /hpf   Epithelial Cells (non renal) None seen 0 - 10 /hpf   Bacteria, UA None seen None seen/Few  Urinalysis, Complete  Result Value Ref Range   Specific Gravity, UA 1.015 1.005 - 1.030   pH, UA 6.0 5.0 - 7.5   Color, UA Yellow Yellow   Appearance Ur Clear Clear   Leukocytes,UA Negative Negative   Protein,UA Negative Negative/Trace   Glucose, UA Negative Negative   Ketones, UA Negative Negative   RBC, UA 1+ (A) Negative   Bilirubin, UA Negative Negative   Urobilinogen, Ur 0.2 0.2 - 1.0 mg/dL   Nitrite, UA Negative Negative   Microscopic Examination See below:   Bladder Scan (Post Void Residual) in office  Result Value Ref Range   Scan Result 579mL    Assessment & Plan:   1. Benign prostatic hyperplasia with urinary obstruction Residual remains markedly elevated, though slightly improved over prior. Recommended pursuing RUS to evaluate for upper tract involvement, and patient agreed. We discussed that evidence of upper tract involvement, either with rising creatinine or RUS evidence of hydronephrosis, would be a clinical indicator that it is time to pursue outlet procedure to preserve renal function.  Continue low dose daily tadalafil, daily silodosin, and daily finasteride. - Urinalysis, Complete - Bladder Scan  (Post Void Residual) in office - US RENAL; Future - Basic metabolic panel - tadalafil (CIALIS) 5 MG tablet; Take 1 tablet (5 mg total) by mouth daily.  Dispense: 30 tablet; Refill: 11 - silodosin (RAPAFLO) 8 MG CAPS capsule; Take 1 capsule (8 mg total) by mouth daily with breakfast.  Dispense: 30 capsule; Refill: 11 - finasteride (PROSCAR) 5 MG tablet; Take 1 tablet (5 mg total) by mouth daily.  Dispense: 30 tablet; Refill: 11  2. Microscopic hematuria Stable. Patient declines cystoscopy  today.  3. Erectile dysfunction, unspecified erectile dysfunction type Stable. Continue demand dose tadalafil. - tadalafil (CIALIS) 20 MG tablet; Take 1 tablet (20 mg total) by mouth daily as needed for erectile dysfunction.  Dispense: 30 tablet; Refill: 2   Return in about 1 year (around 05/14/2022) for Annual DRE/IPSS/PVR/UA with PSA prior.  Debroah Loop, PA-C  [facility] [address] [place], [place] [address] [phone]

## 2021-05-15 LAB — BASIC METABOLIC PANEL
BUN/Creatinine Ratio: 13 (ref 9–20)
BUN: 13 mg/dL (ref 6–24)
CO2: 27 mmol/L (ref 20–29)
Calcium: 10 mg/dL (ref 8.7–10.2)
Chloride: 103 mmol/L (ref 96–106)
Creatinine, Ser: 0.98 mg/dL (ref 0.76–1.27)
Glucose: 89 mg/dL (ref 70–99)
Potassium: 5.4 mmol/L — ABNORMAL HIGH (ref 3.5–5.2)
Sodium: 144 mmol/L (ref 134–144)
eGFR: 89 mL/min/{1.73_m2} (ref >59)

## 2021-05-17 ENCOUNTER — Other Ambulatory Visit: Payer: Self-pay | Admitting: Physician Assistant

## 2021-05-17 DIAGNOSIS — N138 Other obstructive and reflux uropathy: Secondary | ICD-10-CM

## 2021-05-17 DIAGNOSIS — N529 Male erectile dysfunction, unspecified: Secondary | ICD-10-CM

## 2021-05-17 DIAGNOSIS — N401 Enlarged prostate with lower urinary tract symptoms: Secondary | ICD-10-CM

## 2021-05-17 MED ORDER — TADALAFIL 20 MG PO TABS: 20.0000 mg | ORAL_TABLET | Freq: Every day | ORAL | 2 refills | Status: DC | PRN

## 2021-05-17 MED ORDER — TADALAFIL 5 MG PO TABS: 5.0000 mg | ORAL_TABLET | Freq: Every day | ORAL | 11 refills | Status: DC

## 2021-05-19 DIAGNOSIS — L720 Epidermal cyst: Secondary | ICD-10-CM | POA: Diagnosis not present

## 2021-05-19 DIAGNOSIS — C44622 Squamous cell carcinoma of skin of right upper limb, including shoulder: Secondary | ICD-10-CM | POA: Diagnosis not present

## 2021-05-24 ENCOUNTER — Other Ambulatory Visit: Payer: Self-pay | Admitting: Internal Medicine

## 2021-05-24 DIAGNOSIS — E782 Mixed hyperlipidemia: Secondary | ICD-10-CM

## 2021-05-25 NOTE — Telephone Encounter (Signed)
Requested Prescriptions  Pending Prescriptions Disp Refills   atorvastatin (LIPITOR) 10 MG tablet [Pharmacy Med Name: ATORVASTATIN 10 MG TABLET] 90 tablet 0    Sig: TAKE 1 TABLET BY MOUTH EVERY DAY     Cardiovascular:  Antilipid - Statins Failed - 05/24/2021  1:29 PM      Failed - Total Cholesterol in normal range and within 360 days    Cholesterol, Total  Date Value Ref Range Status  08/03/2020 223 (H) 100 - 199 mg/dL Final         Failed - LDL in normal range and within 360 days    LDL Chol Calc (NIH)  Date Value Ref Range Status  08/03/2020 134 (H) 0 - 99 mg/dL Final         Failed - Triglycerides in normal range and within 360 days    Triglycerides  Date Value Ref Range Status  08/03/2020 169 (H) 0 - 149 mg/dL Final         Passed - HDL in normal range and within 360 days    HDL  Date Value Ref Range Status  08/03/2020 59 >39 mg/dL Final         Passed - Patient is not pregnant      Passed - Valid encounter within last 12 months    Recent Outpatient Visits          9 months ago Annual physical exam   [facility] Glean Hess, MD   1 year ago Annual physical exam   [facility] Glean Hess, MD   3 years ago Annual physical exam   [facility] Glean Hess, MD   4 years ago Annual physical exam   [facility] Glean Hess, MD   4 years ago Acute non-recurrent sinusitis, unspecified location   [facility] Glean Hess, MD      Future Appointments            In 2 months Army Melia Jesse Sans, MD [facility] [place]   In 11 months Debroah Loop, [facility]

## 2021-05-27 ENCOUNTER — Ambulatory Visit: Payer: BC Managed Care – PPO

## 2021-05-27 ENCOUNTER — Other Ambulatory Visit: Payer: Self-pay

## 2021-05-27 ENCOUNTER — Ambulatory Visit
Admission: RE | Admit: 2021-05-27 | Discharge: 2021-05-27 | Disposition: A | Discharge status: Home / Self Care | Payer: BC Managed Care – PPO | Source: Ambulatory Visit | Attending: Physician Assistant | Admitting: Physician Assistant

## 2021-05-27 DIAGNOSIS — N138 Other obstructive and reflux uropathy: Secondary | ICD-10-CM | POA: Diagnosis not present

## 2021-05-27 DIAGNOSIS — N401 Enlarged prostate with lower urinary tract symptoms: Secondary | ICD-10-CM | POA: Insufficient documentation

## 2021-05-27 DIAGNOSIS — N3289 Other specified disorders of bladder: Secondary | ICD-10-CM | POA: Diagnosis not present

## 2021-05-27 DIAGNOSIS — R339 Retention of urine, unspecified: Secondary | ICD-10-CM | POA: Diagnosis not present

## 2021-05-27 DIAGNOSIS — N323 Diverticulum of bladder: Secondary | ICD-10-CM | POA: Diagnosis not present

## 2021-05-28 ENCOUNTER — Telehealth: Payer: Self-pay

## 2021-05-28 ENCOUNTER — Ambulatory Visit: Payer: Self-pay

## 2021-05-28 NOTE — Telephone Encounter (Signed)
°  Chief Complaint: blood in stool Symptoms: blood in stool, diarrhea Frequency: 1 day Pertinent Negatives: NA Disposition: [] ED /[x] Urgent Care (no appt availability in office) / [] Appointment(In office/virtual)/ []  [facility] [facility]/ [] Home Care/ [] Refused Recommended Disposition /[] [place] [facility]/ []  Follow-up with PCP Additional Notes: pt states he noticed blood in stool last night and still present today. Advised pt no appts in office until 06/02/21, advised to go to UC and be seen. He states if still having symptoms in morning he would go.    Reason for Disposition  MILD rectal bleeding (more than just a few drops or streaks)  Answer Assessment - Initial Assessment Questions 1. APPEARANCE of BLOOD: "What color is it?" "Is it passed separately, on the surface of the stool, or mixed in with the stool?"      Blood in stool 2. AMOUNT: "How much blood was passed?"      minimal 3. FREQUENCY: "How many times has blood been passed with the stools?"      *No Answer* 4. ONSET: "When was the blood first seen in the stools?" (Days or weeks)      Last night 5. DIARRHEA: "Is there also some diarrhea?" If Yes, ask: "How many diarrhea stools in the past 24 hours?"      yes 6. CONSTIPATION: "Do you have constipation?" If Yes, ask: "How bad is it?"     no 8. BLOOD THINNERS: "Do you take any blood thinners?" (e.g., Coumadin/warfarin, Pradaxa/dabigatran, aspirin)     *No Answer* 9. OTHER SYMPTOMS: "Do you have any other symptoms?"  (e.g., abdomen pain, vomiting, dizziness, fever)     *No Answer*  Protocols used: Rectal Bleeding-A-AH

## 2021-05-28 NOTE — Telephone Encounter (Signed)
-----   Message from [place], [place] sent at 05/28/2021  8:46 AM EST ----- Kidneys look good, no evidence of swelling secondary to chronic retention. Keep follow-up as planned. ----- Message ----- From: Interface, Rad Results In Sent: 05/28/2021   2:16 AM EST To: Debroah Loop, PA-C

## 2021-05-28 NOTE — Telephone Encounter (Signed)
Notified pt of results via [place]. See separate encounter.

## 2021-05-29 ENCOUNTER — Ambulatory Visit
Admission: EM | Admit: 2021-05-29 | Discharge: 2021-05-29 | Disposition: A | Discharge status: Home / Self Care | Payer: BC Managed Care – PPO | Attending: Emergency Medicine | Admitting: Emergency Medicine

## 2021-05-29 ENCOUNTER — Other Ambulatory Visit: Payer: Self-pay

## 2021-05-29 DIAGNOSIS — R195 Other fecal abnormalities: Secondary | ICD-10-CM | POA: Diagnosis not present

## 2021-05-29 DIAGNOSIS — R1013 Epigastric pain: Secondary | ICD-10-CM | POA: Diagnosis not present

## 2021-05-29 LAB — OCCULT BLOOD X 1 CARD TO LAB, STOOL: Fecal Occult Bld: POSITIVE — AB

## 2021-05-29 MED ORDER — OMEPRAZOLE 20 MG PO CPDR: 20.0000 mg | DELAYED_RELEASE_CAPSULE | Freq: Every day | ORAL | 1 refills | Status: DC

## 2021-05-29 NOTE — Discharge Instructions (Addendum)
Your stool was positive for blood which is concerning that this may be coming from higher up in your GI tract such as her stomach.  With your epigastric pain/indigestion this may be a result of gastritis or may be an ulcer that is developing.  I Minna start you on a proton pump inhibitor called omeprazole that when she is taking once daily at bedtime.  This will cut down acid production in your stomach which will help cut down on irritation of the gastric lining.  I am also referring you to [facility] for further evaluation.  This may be related to gastric inflammation from your infection that you are currently experiencing and the wound on your arm or this may be indication of a more concerning condition such as gastric ulcer formation.  If you develop any sharp increase in abdominal pain, fever, develop any coffee-ground emesis, or passed frank blood through your stool please go to the ER for evaluation.

## 2021-05-29 NOTE — ED Triage Notes (Signed)
Pt c/o diarrhea, abdominal pain, abdominal noise since yesterday. Pt noticed blood on toilet paper after 5th diarrhea episode, and after 6th diarrhea he had more bright blood. Noticed some blood this morning but stools are a bit firmer now. Pt is currently on antibiotic clindamycin for a skin infection, but this started before taking it.

## 2021-05-29 NOTE — ED Provider Notes (Signed)
[facility]    CSN: [id number] Arrival date & time: 05/29/21  1011      History   Chief Complaint Chief Complaint  Patient presents with   Abdominal Pain    Abdominal noises    Diarrhea    HPI Charles Turner is a 60 y.o. male.   HPI  60 year old male here for evaluation of abdominal complaints.  Patient reports that 2 days ago he developed some gastric discomfort and what he describes as churning and noise in his abdomen.  He then developed diarrhea.  After his fifth episode of diarrhea he noticed some blood on top of when he wiped.  This increased after his sixth episode of diarrhea.  He did notice some blood this morning and reports that his stools are firmer.  He denies seeing any blood in his stool, having any fevers, does not remember eating any suspicious foods, no other people at home have similar symptoms, and patient denies any rectal pain or itching.  He does have a sutured laceration on his right wrist that is inflamed and hot.  He began clindamycin yesterday evening for a potential wound infection.  Past Medical History:  Diagnosis Date   Anxiety    High cholesterol    Prostate enlargement     Patient Active Problem List   Diagnosis Date Noted   Primary osteoarthritis of both knees 03/15/2016   Urinary retention due to benign prostatic hyperplasia 12/22/2015   Generalized anxiety disorder 06/17/2015   Bee sting allergy 06/17/2015   Mixed hyperlipidemia 05/05/2015   Bladder neoplasm 05/05/2015   Abnormal LFTs 05/05/2015   Failure of erection 05/05/2015   Eunuchoidism 05/05/2015    No past surgical history on file.     Home Medications    Prior to Admission medications   Medication Sig Start Date End Date Taking? Authorizing Provider  omeprazole (PRILOSEC) 20 MG capsule Take 1 capsule (20 mg total) by mouth daily. 05/29/21  Yes Margarette Canada, NP  atorvastatin (LIPITOR) 10 MG tablet TAKE 1 TABLET BY MOUTH EVERY DAY 05/25/21   Glean Hess, MD  Chlorpheniramine Maleate (ALLERGY PO) Take by mouth as needed.    [provider]  clindamycin (CLEOCIN) 300 MG capsule Take 300 mg by mouth 3 (three) times daily. 05/28/21   [provider]  EPINEPHrine 0.3 mg/0.3 mL IJ SOAJ injection INJECT 0.3 MLS (0.3 MG TOTAL) INTO THE MUSCLE ONCE FOR 1 DOSE. 07/31/19   [provider]  finasteride (PROSCAR) 5 MG tablet Take 1 tablet (5 mg total) by mouth daily. 05/14/21   Vaillancourt, Aldona Bar, PA-C  Multiple Vitamin (MULTI VITAMIN PO) Take by mouth daily.    [provider]  PARoxetine (PAXIL-CR) 25 MG 24 hr tablet TAKE 1 TABLET (25 MG TOTAL) BY MOUTH DAILY. 11/27/20   Glean Hess, MD  silodosin (RAPAFLO) 8 MG CAPS capsule Take 1 capsule (8 mg total) by mouth daily with breakfast. 05/14/21   Vaillancourt, Aldona Bar, PA-C  tadalafil (CIALIS) 20 MG tablet Take 1 tablet (20 mg total) by mouth daily as needed for erectile dysfunction. 05/17/21   Vaillancourt, Aldona Bar, PA-C  tadalafil (CIALIS) 5 MG tablet Take 1 tablet (5 mg total) by mouth daily. 05/17/21   Vaillancourt, Aldona Bar, PA-C  Turmeric (QC TUMERIC COMPLEX PO) Take by mouth daily.    [provider]    Family History Family History  Adopted: Yes  Family history unknown: Yes    Social History Social History   Tobacco Use  Smoking status: Never   Smokeless tobacco: Former    Types: Chew  Substance Use Topics   Alcohol use: No    Alcohol/week: 0.0 standard drinks   Drug use: No     Allergies   Bee venom and Wasp venom   Review of Systems Review of Systems  Constitutional:  Negative for activity change, appetite change and fever.  Gastrointestinal:  Positive for abdominal pain, anal bleeding and diarrhea. Negative for blood in stool, nausea, rectal pain and vomiting.  Skin:  Positive for color change and wound. Negative for rash.  Hematological: Negative.   Psychiatric/Behavioral: Negative.      Physical Exam Triage  Vital Signs ED Triage Vitals [05/29/21 1054]  Enc Vitals Group     BP (!) 109/97     Pulse Rate 88     Resp 16     Temp 98.6 F (37 C)     Temp src      SpO2 (!) 88 %     Weight      Height      Head Circumference      Peak Flow      Pain Score      Pain Loc      Pain Edu?      Excl. in [place]?    No data found.  Updated Vital Signs BP (!) 109/97 (BP Location: Left Arm)    Pulse 88    Temp 98.6 F (37 C)    Resp 16    SpO2 (!) 88%   Visual Acuity Right Eye Distance:   Left Eye Distance:   Bilateral Distance:    Right Eye Near:   Left Eye Near:    Bilateral Near:     Physical Exam Vitals and nursing note reviewed.  Constitutional:      General: He is not in acute distress.    Appearance: Normal appearance. He is normal weight. He is not ill-appearing.  HENT:     Head: Normocephalic and atraumatic.  Cardiovascular:     Rate and Rhythm: Normal rate and regular rhythm.     Pulses: Normal pulses.     Heart sounds: Normal heart sounds. No murmur heard.   No gallop.  Pulmonary:     Effort: Pulmonary effort is normal.     Breath sounds: Normal breath sounds. No wheezing, rhonchi or rales.  Abdominal:     General: Abdomen is flat.     Palpations: Abdomen is soft.     Tenderness: There is no abdominal tenderness. There is no guarding or rebound.  Skin:    General: Skin is warm and dry.     Capillary Refill: Capillary refill takes less than 2 seconds.     Findings: No erythema or rash.  Neurological:     General: No focal deficit present.     Mental Status: He is alert and oriented to person, place, and time.  Psychiatric:        Mood and Affect: Mood normal.        Behavior: Behavior normal.        Thought Content: Thought content normal.        Judgment: Judgment normal.     UC Treatments / Results  Labs (all labs ordered are listed, but only abnormal results are displayed) Labs Reviewed  OCCULT BLOOD X 1 CARD TO LAB, STOOL - Abnormal; Notable for the  following components:      Result Value   Fecal Occult Bld POSITIVE (*)  All other components within normal limits    EKG   Radiology No results found.  Procedures Procedures (including critical care time)  Medications Ordered in UC Medications - No data to display  Initial Impression / Assessment and Plan / UC Course  I have reviewed the triage vital signs and the nursing notes.  Pertinent labs & imaging results that were available during my care of the patient were reviewed by me and considered in my medical decision making (see chart for details).  Patient is a nontoxic-appearing 60 year old male here for evaluation of epigastric abdominal pain, gastric turning, and diarrhea.  He has noticed some blood when he wipes that started after his fifth episode of diarrhea.  He reports that his stools are firmer today but he did also notice some blood.  He denies seeing any blood in his stool or having any tarry stools.  He states that the abdominal pain is more feeling like indigestion and is concerned this may be a result of something he ate.  He is currently on clindamycin for a wound infection on his right wrist but he started the clindamycin after his symptoms began.  His physical exam reveals a benign cardiopulmonary exam with clear lung sounds in all fields.  Patient had no soft, flat, nontender to palpation.  Patient deferred rectal exam and he is attempting to give [place] a stool sample to check for the presence of blood.  I am suspicious that the patient's gastric symptoms are either foodborne or they are related to the infection on his right forearm.  Patient was able to produce a small stool sample of formed dark brown stool without any melena or tarriness.  Sample sent to lab for stool guaiac.  Patient stool guaiac is positive.  Will start patient on PPI and refer him to gastroenterology for evaluation of possible ulcer.   Final Clinical Impressions(s) / UC Diagnoses   Final  diagnoses:  Abdominal pain, epigastric  Guaiac positive stools     Discharge Instructions      Your stool was positive for blood which is concerning that this may be coming from higher up in your GI tract such as her stomach.  With your epigastric pain/indigestion this may be a result of gastritis or may be an ulcer that is developing.  I Minna start you on a proton pump inhibitor called omeprazole that when she is taking once daily at bedtime.  This will cut down acid production in your stomach which will help cut down on irritation of the gastric lining.  I am also referring you to [facility] for further evaluation.  This may be related to gastric inflammation from your infection that you are currently experiencing and the wound on your arm or this may be indication of a more concerning condition such as gastric ulcer formation.  If you develop any sharp increase in abdominal pain, fever, develop any coffee-ground emesis, or passed frank blood through your stool please go to the ER for evaluation.     ED Prescriptions     Medication Sig Dispense Auth. Provider   omeprazole (PRILOSEC) 20 MG capsule Take 1 capsule (20 mg total) by mouth daily. 30 capsule Margarette Canada, NP      PDMP not reviewed this encounter.   Margarette Canada, NP 05/29/21 1153

## 2021-06-01 ENCOUNTER — Encounter: Payer: Self-pay | Admitting: Internal Medicine

## 2021-06-24 ENCOUNTER — Other Ambulatory Visit: Payer: Self-pay

## 2021-06-24 ENCOUNTER — Ambulatory Visit (INDEPENDENT_AMBULATORY_CARE_PROVIDER_SITE_OTHER): Payer: BC Managed Care – PPO | Admitting: Gastroenterology

## 2021-06-24 ENCOUNTER — Encounter: Payer: Self-pay | Admitting: Gastroenterology

## 2021-06-24 VITALS — BP 127/76 | HR 68 | Temp 98.6°F | Ht 72.0 in | Wt 222.0 lb

## 2021-06-24 DIAGNOSIS — R195 Other fecal abnormalities: Secondary | ICD-10-CM | POA: Diagnosis not present

## 2021-06-24 MED ORDER — NA SULFATE-K SULFATE-MG SULF 17.5-3.13-1.6 GM/177ML PO SOLN: 354.0000 mL | Freq: Once | ORAL | 0 refills | Status: AC

## 2021-06-24 NOTE — Progress Notes (Signed)
Jonathon Bellows MD, MRCP(U.K) [address]  [place]  [place], [place] [address]  Main: [phone]  Fax: [phone]   Gastroenterology Consultation  Referring Provider:     Glean Hess, MD Primary Care Physician:  Glean Hess, MD Primary Gastroenterologist:  Dr. Jonathon Bellows  Reason for Consultation:   Epigastric pain and guaiac positive stools        HPI:   Trini Christiansen is a 61 y.o. y/o male referred for consultation & management  by Dr. Army Melia, Jesse Sans, MD.    He was seen at the urgent care at [facility] on 05/29/2021 for abdominal discomfort followed by diarrhea and he noticed some blood when he wiped.  He had taken clindamycin the day before for a wound infection after a laceration of the right wrist he had stool tested for blood which was positive.  BMP was 1 normal except elevated potassium at 5.4.    He states that since the urgent care visit he has had no lower GI issues no more rectal bleeding.  21 episode where he noticed some blood on the stool.  Presently has formed stools.  Also gurgling and rumbling sounds in his upper part of the abdomen.  No nausea no vomiting no abdominal pain no unintentional weight loss.  He consumes soda 3-4 times a week.  He says he had a Cologuard previously which was negative.  Past Medical History:  Diagnosis Date   Anxiety    High cholesterol    Prostate enlargement     No past surgical history on file.  Prior to Admission medications   Medication Sig Start Date End Date Taking? Authorizing Provider  atorvastatin (LIPITOR) 10 MG tablet Take 1 tablet by mouth daily. 11/27/20   [provider]  Chlorpheniramine Maleate (ALLERGY PO) Take by mouth as needed.    [provider]  clindamycin (CLEOCIN) 300 MG capsule Take 300 mg by mouth 3 (three) times daily. 05/28/21   [provider]  clobetasol cream (TEMOVATE) 6.38 % Apply 1 application topically 2 (two) times daily. 05/24/21   [provider]  EPINEPHrine 0.3 mg/0.3 mL IJ SOAJ injection Inject 0.3 mLs into the skin as needed. 12/10/20   [provider]  finasteride (PROSCAR) 5 MG tablet Take 1 tablet by mouth daily. 10/24/20   [provider]  mometasone (ELOCON) 0.1 % ointment Apply topically 2 (two) times daily as needed. 05/28/21   [provider]  Multiple Vitamin (MULTI VITAMIN PO) Take by mouth daily.    [provider]  omeprazole (PRILOSEC) 20 MG capsule Take 1 capsule (20 mg total) by mouth daily. 05/29/21   Margarette Canada, NP  PARoxetine (PAXIL-CR) 25 MG 24 hr tablet TAKE 1 TABLET (25 MG TOTAL) BY MOUTH DAILY. 11/27/20   Glean Hess, MD  tadalafil (CIALIS) 20 MG tablet Take 1 tablet (20 mg total) by mouth daily as needed for erectile dysfunction. 05/17/21   Vaillancourt, Aldona Bar, PA-C  tadalafil (CIALIS) 5 MG tablet Take 1 tablet (5 mg total) by mouth daily. 05/17/21   Vaillancourt, Aldona Bar, PA-C  tamsulosin (FLOMAX) 0.4 MG CAPS capsule Take 0.8 mg by mouth daily. 02/26/21   [provider]  Turmeric (QC TUMERIC COMPLEX PO) Take by mouth daily.    [provider]    Family History  Adopted: Yes  Family history unknown: Yes     Social History   Tobacco Use   Smoking status: Never   Smokeless tobacco:  Former    Types: Chew  Substance Use Topics   Alcohol use: No    Alcohol/week: 0.0 standard drinks   Drug use: No    Allergies as of 06/24/2021 - Review Complete 06/24/2021  Allergen Reaction Noted   Bee venom Anaphylaxis 01/04/2015   Wasp venom Anaphylaxis 01/04/2015    Review of Systems:    All systems reviewed and negative except where noted in HPI.   Physical Exam:  BP 127/76    Pulse 68    Temp 98.6 F (37 C) (Oral)    Ht 6' (1.829 m)    Wt 222 lb (100.7 kg)    BMI 30.11 kg/m  No LMP for male patient. Psych:  Alert and cooperative. Normal mood and affect. General:   Alert,  Well-developed, well-nourished, pleasant and cooperative  in NAD Head:  Normocephalic and atraumatic. Eyes:  Sclera clear, no icterus.   Conjunctiva pink. Ears:  Normal auditory acuity.\ Lungs:  Respirations even and unlabored.  Clear throughout to auscultation.   No wheezes, crackles, or rhonchi. No acute distress. Heart:  Regular rate and rhythm; no murmurs, clicks, rubs, or gallops. Abdomen:  Normal bowel sounds.  No bruits.  Soft, non-tender and non-distended without masses, hepatosplenomegaly or hernias noted.  No guarding or rebound tenderness.   s. Neurologic:  Alert and oriented x3;  grossly normal neurologically. Psych:  Alert and cooperative. Normal mood and affect.  Imaging Studies: US RENAL  Result Date: 05/28/2021 CLINICAL DATA:  BPH with chronic urinary retention. EXAM: RENAL / URINARY TRACT ULTRASOUND COMPLETE COMPARISON:  None. FINDINGS: Right Kidney: Renal measurements: 12.8 x 5.6 x 5.3 cm = volume: 198 mL. Echogenicity within normal limits. No mass or hydronephrosis visualized. Left Kidney: Renal measurements: 11.8 x 6.3 x 5.0 cm = volume: 192 mL. Echogenicity within normal limits. No mass or hydronephrosis visualized. Bladder: There is trabeculated appearance of the bladder wall, likely related to chronic bladder outlet obstruction. Posterior bladder wall diverticula noted. The prevoid bladder volume is 956 cc and postvoid volume of 570 cc. Other: The prostate gland is enlarged with median lobe hypertrophy indenting the base of the bladder. The prostate gland measures 7.2 x 5.0 x 4.0 cm for a volume of 75 cc. IMPRESSION: 1. Unremarkable kidneys. 2. Trabeculated bladder pole suggestive of chronic bladder outlet obstruction, likely secondary to enlarged prostate gland. Electronically Signed   By: Anner Crete M.D.   On: 05/28/2021 02:14    Assessment and Plan:   Rohit Deloria is a 61 y.o. y/o male has been referred for Epigastric pain and guaic positive.  Epigastric discomfort occurred in the setting of clindamycin use after a  skin surgery.  Very likely secondary to antibiotic use.  Denies any GI symptoms at this point of time except gurgling sounds in his abdomen which may be related to consumption of sodas and fermentation of artificial sugars.   Plan  Colonoscopy to evaluate blood in the stool. Avoid food as a trial of low FODMAP diet if symptoms of gurgling rumbling persists subsequently come back and see me in the office.  I have discussed alternative options, risks & benefits,  which include, but are not limited to, bleeding, infection, perforation,respiratory complication & drug reaction.  The patient agrees with this plan & written consent will be obtained.    Follow up in as needed  Dr Jonathon Bellows MD,MRCP(U.K)

## 2021-06-24 NOTE — Patient Instructions (Signed)
Low-FODMAP Eating Plan °FODMAP stands for fermentable oligosaccharides, disaccharides, monosaccharides, and polyols. These are sugars that are hard for some people to digest. A low-FODMAP eating plan may help some people who have irritable bowel syndrome (IBS) and certain other bowel (intestinal) diseases to manage their symptoms. °This meal plan can be complicated to follow. Work with a diet and nutrition specialist (dietitian) to make a low-FODMAP eating plan that is right for you. A dietitian can help make sure that you get enough nutrition from this diet. °What are tips for following this plan? °Reading food labels °Check labels for hidden FODMAPs such as: °High-fructose syrup. °Honey. °Agave. °Natural fruit flavors. °Onion or garlic powder. °Choose low-FODMAP foods that contain 3-4 grams of fiber per serving. °Check food labels for serving sizes. Eat only one serving at a time to make sure FODMAP levels stay low. °Shopping °Shop with a list of foods that are recommended on this diet and make a meal plan. °Meal planning °Follow a low-FODMAP eating plan for up to 6 weeks, or as told by your health care provider or dietitian. °To follow the eating plan: °Eliminate high-FODMAP foods from your diet completely. Choose only low-FODMAP foods to eat. You will do this for 2-6 weeks. °Gradually reintroduce high-FODMAP foods into your diet one at a time. Most people should wait a few days before introducing the next new high-FODMAP food into their meal plan. Your dietitian can recommend how quickly you may reintroduce foods. °Keep a daily record of what and how much you eat and drink. Make note of any symptoms that you have after eating. °Review your daily record with a dietitian regularly to identify which foods you can eat and which foods you should avoid. °General tips °Drink enough fluid each day to keep your urine pale yellow. °Avoid processed foods. These often have added sugar and may be high in FODMAPs. °Avoid most  dairy products, whole grains, and sweeteners. °Work with a dietitian to make sure you get enough fiber in your diet. °Avoid high FODMAP foods at meals to manage symptoms. °Recommended foods °Fruits °Bananas, oranges, tangerines, lemons, limes, blueberries, raspberries, strawberries, grapes, cantaloupe, honeydew melon, kiwi, papaya, passion fruit, and pineapple. Limited amounts of dried cranberries, banana chips, and shredded coconut. °Vegetables °Eggplant, zucchini, cucumber, peppers, green beans, bean sprouts, lettuce, arugula, kale, Swiss chard, spinach, collard greens, bok choy, summer squash, potato, and tomato. Limited amounts of corn, carrot, and sweet potato. Green parts of scallions. °Grains °Gluten-free grains, such as rice, oats, buckwheat, quinoa, corn, polenta, and millet. Gluten-free pasta, bread, or cereal. Rice noodles. Corn tortillas. °Meats and other proteins °Unseasoned beef, pork, poultry, or fish. Eggs. Bacon. Tofu (firm) and tempeh. Limited amounts of nuts and seeds, such as almonds, walnuts, brazil nuts, pecans, peanuts, nut butters, pumpkin seeds, chia seeds, and sunflower seeds. °Dairy °Lactose-free milk, yogurt, and kefir. Lactose-free cottage cheese and ice cream. Non-dairy milks, such as almond, coconut, hemp, and rice milk. Non-dairy yogurt. Limited amounts of goat cheese, brie, mozzarella, parmesan, swiss, and other hard cheeses. °Fats and oils °Butter-free spreads. Vegetable oils, such as olive, canola, and sunflower oil. °Seasoning and other foods °Artificial sweeteners with names that do not end in "ol," such as aspartame, saccharine, and stevia. Maple syrup, white table sugar, raw sugar, brown sugar, and molasses. Mayonnaise, soy sauce, and tamari. Fresh basil, coriander, parsley, rosemary, and thyme. °Beverages °Water and mineral water. Sugar-sweetened soft drinks. Small amounts of orange juice or cranberry juice. Black and green tea. Most dry wines. Coffee. °  The items listed above  may not be a complete list of foods and beverages you can eat. Contact a dietitian for more information. °Foods to avoid °Fruits °Fresh, dried, and juiced forms of apple, pear, watermelon, peach, plum, cherries, apricots, blackberries, boysenberries, figs, nectarines, and mango. Avocado. °Vegetables °Chicory root, artichoke, asparagus, cabbage, snow peas, Brussels sprouts, broccoli, sugar snap peas, mushrooms, celery, and cauliflower. Onions, garlic, leeks, and the white part of scallions. °Grains °Wheat, including kamut, durum, and semolina. Barley and bulgur. Couscous. Wheat-based cereals. Wheat noodles, bread, crackers, and pastries. °Meats and other proteins °Fried or fatty meat. Sausage. Cashews and pistachios. Soybeans, baked beans, black beans, chickpeas, kidney beans, fava beans, navy beans, lentils, black-eyed peas, and split peas. °Dairy °Milk, yogurt, ice cream, and soft cheese. Cream and sour cream. Milk-based sauces. Custard. Buttermilk. Soy milk. °Seasoning and other foods °Any sugar-free gum or candy. Foods that contain artificial sweeteners such as sorbitol, mannitol, isomalt, or xylitol. Foods that contain honey, high-fructose corn syrup, or agave. Bouillon, vegetable stock, beef stock, and chicken stock. Garlic and onion powder. Condiments made with onion, such as hummus, chutney, pickles, relish, salad dressing, and salsa. Tomato paste. °Beverages °Chicory-based drinks. Coffee substitutes. Chamomile tea. Fennel tea. Sweet or fortified wines such as port or sherry. Diet soft drinks made with isomalt, mannitol, maltitol, sorbitol, or xylitol. Apple, pear, and mango juice. Juices with high-fructose corn syrup. °The items listed above may not be a complete list of foods and beverages you should avoid. Contact a dietitian for more information. °Summary °FODMAP stands for fermentable oligosaccharides, disaccharides, monosaccharides, and polyols. These are sugars that are hard for some people to  digest. °A low-FODMAP eating plan is a short-term diet that helps to ease symptoms of certain bowel diseases. °The eating plan usually lasts up to 6 weeks. After that, high-FODMAP foods are reintroduced gradually and one at a time. This can help you find out which foods may be causing symptoms. °A low-FODMAP eating plan can be complicated. It is best to work with a dietitian who has experience with this type of plan. °This information is not intended to replace advice given to you by your health care provider. Make sure you discuss any questions you have with your health care provider. °Document Revised: 10/03/2019 Document Reviewed: 10/03/2019 °Elsevier Patient Education © 2022 [employer] ° °

## 2021-08-06 ENCOUNTER — Other Ambulatory Visit: Payer: Self-pay

## 2021-08-06 ENCOUNTER — Ambulatory Visit (INDEPENDENT_AMBULATORY_CARE_PROVIDER_SITE_OTHER): Payer: BC Managed Care – PPO | Admitting: Internal Medicine

## 2021-08-06 ENCOUNTER — Encounter: Payer: Self-pay | Admitting: Internal Medicine

## 2021-08-06 VITALS — BP 112/72 | HR 68 | Ht 72.0 in | Wt 219.0 lb

## 2021-08-06 DIAGNOSIS — F411 Generalized anxiety disorder: Secondary | ICD-10-CM | POA: Diagnosis not present

## 2021-08-06 DIAGNOSIS — N401 Enlarged prostate with lower urinary tract symptoms: Secondary | ICD-10-CM | POA: Diagnosis not present

## 2021-08-06 DIAGNOSIS — E782 Mixed hyperlipidemia: Secondary | ICD-10-CM | POA: Diagnosis not present

## 2021-08-06 DIAGNOSIS — Z0000 Encounter for general adult medical examination without abnormal findings: Secondary | ICD-10-CM | POA: Diagnosis not present

## 2021-08-06 DIAGNOSIS — Z1211 Encounter for screening for malignant neoplasm of colon: Secondary | ICD-10-CM | POA: Diagnosis not present

## 2021-08-06 DIAGNOSIS — C44622 Squamous cell carcinoma of skin of right upper limb, including shoulder: Secondary | ICD-10-CM

## 2021-08-06 DIAGNOSIS — R338 Other retention of urine: Secondary | ICD-10-CM

## 2021-08-06 MED ORDER — PAROXETINE HCL ER 25 MG PO TB24: 25.0000 mg | ORAL_TABLET | Freq: Every day | ORAL | 3 refills | Status: DC

## 2021-08-06 NOTE — Progress Notes (Signed)
Date:  08/06/2021   Name:  Charles Turner   DOB:  [birth year]   MRN:  [id number]   Chief Complaint: Annual Exam Charles Turner is a 61 y.o. male who presents today for his Complete Annual Exam. He feels well. He reports exercising. He reports he is sleeping well. He had a bout of rectal bleeding with antibiotics so has scheduled a colonoscopy.  Colonoscopy: cologuard 08/2019; colonoscopy scheduled 09/17/21  Immunization History  Administered Date(s) Administered   PFIZER(Purple Top)SARS-COV-2 Vaccination 09/19/2019, 10/15/2019    Lab Results  Component Value Date   PSA1 1.9 05/11/2021   PSA1 2.1 05/14/2020   PSA1 3.3 05/15/2019    Hyperlipidemia This is a chronic problem. The problem is controlled. Pertinent negatives include no chest pain, myalgias or shortness of breath. Current antihyperlipidemic treatment includes statins.  Anxiety Presents for follow-up visit. Patient reports no chest pain, dizziness, nervous/anxious behavior, palpitations or shortness of breath. The quality of sleep is good.   Compliance with medications is 76-100%.   Lab Results  Component Value Date   NA 144 05/14/2021   K 5.4 (H) 05/14/2021   CO2 27 05/14/2021   GLUCOSE 89 05/14/2021   BUN 13 05/14/2021   CREATININE 0.98 05/14/2021   CALCIUM 10.0 05/14/2021   EGFR 89 05/14/2021   GFRNONAA 84 07/31/2019   Lab Results  Component Value Date   CHOL 223 (H) 08/03/2020   HDL 59 08/03/2020   LDLCALC 134 (H) 08/03/2020   TRIG 169 (H) 08/03/2020   CHOLHDL 3.8 08/03/2020   Lab Results  Component Value Date   TSH 3.350 03/16/2017   No results found for: HGBA1C Lab Results  Component Value Date   WBC 5.8 08/03/2020   HGB 14.2 08/03/2020   HCT 42.5 08/03/2020   MCV 91 08/03/2020   PLT 284 08/03/2020   Lab Results  Component Value Date   ALT 29 08/03/2020   AST 29 08/03/2020   ALKPHOS 57 08/03/2020   BILITOT 0.5 08/03/2020   No results found for: 25OHVITD2, 25OHVITD3, VD25OH    Review of Systems  Constitutional:  Negative for appetite change, chills, diaphoresis, fatigue and unexpected weight change.  HENT:  Negative for hearing loss, tinnitus, trouble swallowing and voice change.   Eyes:  Negative for visual disturbance.  Respiratory:  Negative for choking, shortness of breath and wheezing.   Cardiovascular:  Negative for chest pain, palpitations and leg swelling.  Gastrointestinal:  Negative for abdominal pain, blood in stool, constipation and diarrhea.  Genitourinary:  Negative for difficulty urinating, dysuria and frequency.  Musculoskeletal:  Negative for arthralgias, back pain and myalgias.  Skin:  Positive for wound (redness and lump at site of recent SCCA removal right wrist). Negative for color change and rash.  Neurological:  Negative for dizziness, syncope and headaches.  Hematological:  Negative for adenopathy.  Psychiatric/Behavioral:  Negative for dysphoric mood and sleep disturbance. The patient is not nervous/anxious.    Patient Active Problem List   Diagnosis Date Noted   Primary osteoarthritis of both knees 03/15/2016   Urinary retention due to benign prostatic hyperplasia 12/22/2015   Generalized anxiety disorder 06/17/2015   Bee sting allergy 06/17/2015   Mixed hyperlipidemia 05/05/2015   Bladder neoplasm 05/05/2015   Abnormal LFTs 05/05/2015   Failure of erection 05/05/2015    Allergies  Allergen Reactions   Bee Venom Anaphylaxis   Wasp Venom Anaphylaxis    History reviewed. No pertinent surgical history.  Social History   Tobacco Use  Smoking status: Never   Smokeless tobacco: Former    Types: Chew  Substance Use Topics   Alcohol use: No    Alcohol/week: 0.0 standard drinks   Drug use: No     Medication list has been reviewed and updated.  Current Meds  Medication Sig   atorvastatin (LIPITOR) 10 MG tablet Take 1 tablet by mouth daily.   Chlorpheniramine Maleate (ALLERGY PO) Take by mouth as needed.   clobetasol  cream (TEMOVATE) 7.82 % Apply 1 application topically 2 (two) times daily.   EPINEPHrine 0.3 mg/0.3 mL IJ SOAJ injection Inject 0.3 mLs into the skin as needed.   finasteride (PROSCAR) 5 MG tablet Take 1 tablet by mouth daily.   mometasone (ELOCON) 0.1 % ointment Apply topically 2 (two) times daily as needed.   Multiple Vitamin (MULTI VITAMIN PO) Take by mouth daily.   omeprazole (PRILOSEC) 20 MG capsule Take 1 capsule (20 mg total) by mouth daily.   PARoxetine (PAXIL-CR) 25 MG 24 hr tablet TAKE 1 TABLET (25 MG TOTAL) BY MOUTH DAILY.   silodosin (RAPAFLO) 8 MG CAPS capsule Take 8 mg by mouth daily with breakfast.   tadalafil (CIALIS) 20 MG tablet Take 1 tablet (20 mg total) by mouth daily as needed for erectile dysfunction.   [DISCONTINUED] tamsulosin (FLOMAX) 0.4 MG CAPS capsule Take 0.8 mg by mouth daily.    PHQ 2/9 Scores 08/06/2021 08/03/2020 07/31/2019 03/19/2018  PHQ - 2 Score 0 0 0 0  PHQ- 9 Score 0 0 1 -    GAD 7 : Generalized Anxiety Score 08/06/2021 08/03/2020  Nervous, Anxious, on Edge 0 0  Control/stop worrying 0 0  Worry too much - different things 0 0  Trouble relaxing 0 0  Restless 0 0  Easily annoyed or irritable 0 0  Afraid - awful might happen 0 0  Total GAD 7 Score 0 0  Anxiety Difficulty Not difficult at all -    BP Readings from Last 3 Encounters:  08/06/21 112/72  06/24/21 127/76  05/29/21 (!) 109/97    Physical Exam Vitals and nursing note reviewed.  Constitutional:      Appearance: Normal appearance. He is well-developed.  HENT:     Head: Normocephalic.     Right Ear: Tympanic membrane, ear canal and external ear normal.     Left Ear: Tympanic membrane, ear canal and external ear normal.     Nose: Nose normal.  Eyes:     Conjunctiva/sclera: Conjunctivae normal.     Pupils: Pupils are equal, round, and reactive to light.  Neck:     Thyroid: No thyromegaly.     Vascular: No carotid bruit.  Cardiovascular:     Rate and Rhythm: Normal rate and regular  rhythm.     Pulses: Normal pulses.     Heart sounds: Normal heart sounds.  Pulmonary:     Effort: Pulmonary effort is normal.     Breath sounds: Normal breath sounds. No wheezing or rhonchi.  Chest:  Breasts:    Right: No mass.     Left: No mass.  Abdominal:     General: Bowel sounds are normal.     Palpations: Abdomen is soft.     Tenderness: There is no abdominal tenderness.  Musculoskeletal:        General: Normal range of motion.     Cervical back: Normal range of motion and neck supple.     Right lower leg: No edema.     Left lower leg: No  edema.  Lymphadenopathy:     Cervical: No cervical adenopathy.  Skin:    General: Skin is warm and dry.     Capillary Refill: Capillary refill takes less than 2 seconds.     Findings: Erythema (right wrist with possible retained suture) present.  Neurological:     General: No focal deficit present.     Mental Status: He is alert and oriented to person, place, and time.     Deep Tendon Reflexes: Reflexes are normal and symmetric.  Psychiatric:        Attention and Perception: Attention normal.        Mood and Affect: Mood normal.        Thought Content: Thought content normal.    Wt Readings from Last 3 Encounters:  08/06/21 219 lb (99.3 kg)  06/24/21 222 lb (100.7 kg)  05/14/21 225 lb (102.1 kg)    BP 112/72    Pulse 68    Ht 6' (1.829 m)    Wt 219 lb (99.3 kg)    SpO2 95%    BMI 29.70 kg/m   Assessment and Plan: 1. Annual physical exam Normal exam; continue healthy diet and exercise. Consider Shingrix vaccine - CBC with Differential/Platelet  2. Colon cancer screening Colonoscopy scheduled  3. Urinary retention due to benign prostatic hyperplasia On medications with good control of symptoms Followed by Urology  Recent PSA normal  4. Mixed hyperlipidemia Tolerating statin medication without side effects at this time LDL is at goal of < 70 on current dose Continue same therapy without change at this time. -  Comprehensive metabolic panel - Lipid panel  5. Generalized anxiety disorder Doing well on Paxil daily.  No SI/HI and no medication side effects - TSH - PARoxetine (PAXIL-CR) 25 MG 24 hr tablet; Take 1 tablet (25 mg total) by mouth daily.  Dispense: 90 tablet; Refill: 3  6. Squamous cell skin cancer, wrist, right Follow up with Dermatology   Partially dictated using [place]. Any errors are unintentional.  Halina Maidens, MD [facility]  08/06/2021

## 2021-08-07 LAB — CBC WITH DIFFERENTIAL/PLATELET
Basophils Absolute: 0 10*3/uL (ref 0.0–0.2)
Basos: 1 %
EOS (ABSOLUTE): 0.1 10*3/uL (ref 0.0–0.4)
Eos: 1 %
Hematocrit: 41.5 % (ref 37.5–51.0)
Hemoglobin: 14 g/dL (ref 13.0–17.7)
Immature Grans (Abs): 0 10*3/uL (ref 0.0–0.1)
Immature Granulocytes: 0 %
Lymphocytes Absolute: 2.4 10*3/uL (ref 0.7–3.1)
Lymphs: 40 %
MCH: 30 pg (ref 26.6–33.0)
MCHC: 33.7 g/dL (ref 31.5–35.7)
MCV: 89 fL (ref 79–97)
Monocytes Absolute: 0.5 10*3/uL (ref 0.1–0.9)
Monocytes: 8 %
Neutrophils Absolute: 3.1 10*3/uL (ref 1.4–7.0)
Neutrophils: 50 %
Platelets: 319 10*3/uL (ref 150–450)
RBC: 4.67 x10E6/uL (ref 4.14–5.80)
RDW: 13 % (ref 11.6–15.4)
WBC: 6.1 10*3/uL (ref 3.4–10.8)

## 2021-08-07 LAB — COMPREHENSIVE METABOLIC PANEL
ALT: 28 IU/L (ref 0–44)
AST: 22 IU/L (ref 0–40)
Albumin/Globulin Ratio: 1.8 (ref 1.2–2.2)
Albumin: 4.4 g/dL (ref 3.8–4.9)
Alkaline Phosphatase: 54 IU/L (ref 44–121)
BUN/Creatinine Ratio: 15 (ref 9–20)
BUN: 15 mg/dL (ref 6–24)
Bilirubin Total: 0.6 mg/dL (ref 0.0–1.2)
CO2: 25 mmol/L (ref 20–29)
Calcium: 9.7 mg/dL (ref 8.7–10.2)
Chloride: 101 mmol/L (ref 96–106)
Creatinine, Ser: 0.97 mg/dL (ref 0.76–1.27)
Globulin, Total: 2.4 g/dL (ref 1.5–4.5)
Glucose: 103 mg/dL — ABNORMAL HIGH (ref 70–99)
Potassium: 4.3 mmol/L (ref 3.5–5.2)
Sodium: 138 mmol/L (ref 134–144)
Total Protein: 6.8 g/dL (ref 6.0–8.5)
eGFR: 90 mL/min/{1.73_m2} (ref >59)

## 2021-08-07 LAB — LIPID PANEL
Chol/HDL Ratio: 3.2 ratio (ref 0.0–5.0)
Cholesterol, Total: 175 mg/dL (ref 100–199)
HDL: 55 mg/dL (ref >39)
LDL Chol Calc (NIH): 99 mg/dL (ref 0–99)
Triglycerides: 119 mg/dL (ref 0–149)
VLDL Cholesterol Cal: 21 mg/dL (ref 5–40)

## 2021-08-07 LAB — TSH: TSH: 2.87 u[IU]/mL (ref 0.450–4.500)

## 2021-08-21 ENCOUNTER — Other Ambulatory Visit: Payer: Self-pay | Admitting: Internal Medicine

## 2021-08-21 DIAGNOSIS — E782 Mixed hyperlipidemia: Secondary | ICD-10-CM

## 2021-08-23 NOTE — Telephone Encounter (Signed)
Requested medication (s) are due for refill today: Yes ? ?Requested medication (s) are on the active medication list: Yes ? ?Last refill:  2 months ago ? ?Future visit scheduled: No ? ?Notes to clinic:  Unable to refill per protocol, last refill by another provider. ? ? ? ? ? ?Requested Prescriptions  ?Pending Prescriptions Disp Refills  ? atorvastatin (LIPITOR) 10 MG tablet [Pharmacy Med Name: ATORVASTATIN 10 MG TABLET] 90 tablet   ?  Sig: TAKE 1 TABLET BY MOUTH EVERY DAY  ?  ? Cardiovascular:  Antilipid - Statins Failed - 08/21/2021 10:42 AM  ?  ?  Failed - Lipid Panel in normal range within the last 12 months  ?  Cholesterol, Total  ?Date Value Ref Range Status  ?08/06/2021 175 100 - 199 mg/dL Final  ? ?LDL Chol Calc (NIH)  ?Date Value Ref Range Status  ?08/06/2021 99 0 - 99 mg/dL Final  ? ?HDL  ?Date Value Ref Range Status  ?08/06/2021 55 >39 mg/dL Final  ? ?Triglycerides  ?Date Value Ref Range Status  ?08/06/2021 119 0 - 149 mg/dL Final  ? ?  ?  ?  Passed - Patient is not pregnant  ?  ?  Passed - Valid encounter within last 12 months  ?  Recent Outpatient Visits   ? ?      ? 2 weeks ago Annual physical exam  ? [facility] Glean Hess, MD  ? 1 year ago Annual physical exam  ? [facility] Glean Hess, MD  ? 2 years ago Annual physical exam  ? [facility] Glean Hess, MD  ? 3 years ago Annual physical exam  ? [facility] Glean Hess, MD  ? 4 years ago Annual physical exam  ? [facility] Glean Hess, MD  ? ?  ?  ?Future Appointments   ? ?        ? In 8 months Luana Shu Lee  ? In 11 months Glean Hess, MD [facility] [place]  ? ?  ? ?  ?  ?  ? ? ? ? ?

## 2021-09-13 ENCOUNTER — Other Ambulatory Visit: Payer: Self-pay

## 2021-09-13 ENCOUNTER — Encounter: Payer: Self-pay | Admitting: Gastroenterology

## 2021-09-16 ENCOUNTER — Encounter: Payer: Self-pay | Admitting: Gastroenterology

## 2021-09-17 ENCOUNTER — Encounter: Payer: Self-pay | Admitting: Gastroenterology

## 2021-09-17 ENCOUNTER — Encounter
Admission: RE | Disposition: A | Discharge status: Home / Self Care | Payer: Self-pay | Source: Home / Self Care | Attending: Gastroenterology

## 2021-09-17 ENCOUNTER — Ambulatory Visit
Admission: RE | Admit: 2021-09-17 | Discharge: 2021-09-17 | Disposition: A | Discharge status: Home / Self Care | Payer: BC Managed Care – PPO | Attending: Gastroenterology | Admitting: Gastroenterology

## 2021-09-17 ENCOUNTER — Ambulatory Visit: Payer: BC Managed Care – PPO | Admitting: Certified Registered Nurse Anesthetist

## 2021-09-17 DIAGNOSIS — K635 Polyp of colon: Secondary | ICD-10-CM | POA: Diagnosis not present

## 2021-09-17 DIAGNOSIS — K625 Hemorrhage of anus and rectum: Secondary | ICD-10-CM

## 2021-09-17 DIAGNOSIS — D125 Benign neoplasm of sigmoid colon: Secondary | ICD-10-CM | POA: Diagnosis not present

## 2021-09-17 DIAGNOSIS — Z87891 Personal history of nicotine dependence: Secondary | ICD-10-CM | POA: Insufficient documentation

## 2021-09-17 DIAGNOSIS — E7800 Pure hypercholesterolemia, unspecified: Secondary | ICD-10-CM | POA: Insufficient documentation

## 2021-09-17 DIAGNOSIS — K5730 Diverticulosis of large intestine without perforation or abscess without bleeding: Secondary | ICD-10-CM | POA: Diagnosis not present

## 2021-09-17 DIAGNOSIS — R195 Other fecal abnormalities: Secondary | ICD-10-CM | POA: Diagnosis not present

## 2021-09-17 DIAGNOSIS — K640 First degree hemorrhoids: Secondary | ICD-10-CM | POA: Insufficient documentation

## 2021-09-17 HISTORY — PX: COLONOSCOPY WITH PROPOFOL: SHX5780

## 2021-09-17 SURGERY — COLONOSCOPY WITH PROPOFOL
Anesthesia: General

## 2021-09-17 MED ORDER — SODIUM CHLORIDE 0.9 % IV SOLN
INTRAVENOUS | Status: DC | PRN
Start: 2021-09-17 — End: 2021-09-17

## 2021-09-17 MED ORDER — SODIUM CHLORIDE (PF) 0.9 % IJ SOLN
INTRAMUSCULAR | Status: DC | PRN
  Administered 2021-09-17: 4 mL

## 2021-09-17 MED ORDER — PROPOFOL 10 MG/ML IV BOLUS
INTRAVENOUS | Status: DC | PRN
  Administered 2021-09-17: 80 mg via INTRAVENOUS

## 2021-09-17 MED ORDER — PROPOFOL 500 MG/50ML IV EMUL
INTRAVENOUS | Status: DC | PRN
Start: 2021-09-17 — End: 2021-09-17
  Administered 2021-09-17: 160 ug/kg/min via INTRAVENOUS

## 2021-09-17 MED ORDER — STERILE WATER FOR IRRIGATION IR SOLN
Status: DC | PRN
  Administered 2021-09-17 (×2): 60 mL

## 2021-09-17 NOTE — Op Note (Signed)
[facility] ?Gastroenterology ?Patient Name: Charles Turner ?Procedure Date: 09/17/2021 8:18 AM ?MRN: [id number] ?Account #: [id number] ?Date of Birth: 04-28-1961 ?Admit Type: Outpatient ?Age: 61 ?Room: [facility] ENDO ROOM 4 ?Gender: Male ?Note Status: Finalized ?Instrument Name: Colonoscope [id number] ?Procedure:             Colonoscopy ?Indications:           Rectal bleeding ?Providers:             Jonathon Bellows MD, MD ?Referring MD:          Halina Maidens, MD (Referring MD) ?Medicines:             Monitored Anesthesia Care ?Complications:         No immediate complications. ?Procedure:             Pre-Anesthesia Assessment: ?                       - Prior to the procedure, a History and Physical was  ?                       performed, and patient medications, allergies and  ?                       sensitivities were reviewed. The patient's tolerance  ?                       of previous anesthesia was reviewed. ?                       - The risks and benefits of the procedure and the  ?                       sedation options and risks were discussed with the  ?                       patient. All questions were answered and informed  ?                       consent was obtained. ?                       - ASA Grade Assessment: II - A patient with mild  ?                       systemic disease. ?                       After obtaining informed consent, the colonoscope was  ?                       passed under direct vision. Throughout the procedure,  ?                       the patient's blood pressure, pulse, and oxygen  ?                       saturations were monitored continuously. The  ?                       Colonoscope was introduced through the anus  and  ?                       advanced to the the cecum, identified by the  ?                       appendiceal orifice. The colonoscopy was performed  ?                       with ease. The patient tolerated the procedure well.  ?                       The  quality of the bowel preparation was adequate. ?Findings: ?     The perianal and digital rectal examinations were normal. ?     Non-bleeding internal hemorrhoids were found during retroflexion. The  ?     hemorrhoids were medium-sized and Grade I (internal hemorrhoids that do  ?     not prolapse). ?     Multiple small-mouthed diverticula were found in the sigmoid colon. ?     A 6 mm polyp was found in the sigmoid colon. The polyp was sessile. The  ?     polyp was removed with a cold snare. Resection and retrieval were  ?     complete. ?     A 15 mm polyp was found in the sigmoid colon. The polyp was  ?     semi-pedunculated. Preparations were made for mucosal resection. Saline  ?     was injected to raise the lesion. Snare mucosal resection was performed.  ?     Resection and retrieval were complete. To prevent bleeding after mucosal  ?     resection, one hemostatic clip was successfully placed. There was no  ?     bleeding during, or at the end, of the procedure. Borders of polyp  ?     marked using blue or nbi light ?     The exam was otherwise without abnormality. ?Impression:            - Non-bleeding internal hemorrhoids. ?                       - Diverticulosis in the sigmoid colon. ?                       - One 6 mm polyp in the sigmoid colon, removed with a  ?                       cold snare. Resected and retrieved. ?                       - One 15 mm polyp in the sigmoid colon, removed with  ?                       mucosal resection. Resected and retrieved. Clip was  ?                       placed. ?                       - The examination was otherwise normal. ?                       -  Mucosal resection was performed. Resection and  ?                       retrieval were complete. ?Recommendation:        - Discharge patient to home (with escort). ?                       - Resume previous diet. ?                       - Continue present medications. ?                       - Await pathology results. ?                        - Repeat colonoscopy for surveillance based on  ?                       pathology results. ?Procedure Code(s):     --- Professional --- ?                       [phone], Colonoscopy, flexible; with endoscopic mucosal  ?                       resection ?                       27078, 59, Colonoscopy, flexible; with removal of  ?                       tumor(s), polyp(s), or other lesion(s) by snare  ?                       technique ?Diagnosis Code(s):     --- Professional --- ?                       K64.0, First degree hemorrhoids ?                       K63.5, Polyp of colon ?                       K62.5, Hemorrhage of anus and rectum ?                       K57.30, Diverticulosis of large intestine without  ?                       perforation or abscess without bleeding ?CPT copyright 2019 [employer]. All rights reserved. ?The codes documented in this report are preliminary and upon coder review may  ?be revised to meet current compliance requirements. ?Jonathon Bellows, MD ?Jonathon Bellows MD, MD ?09/17/2021 8:47:15 AM ?This report has been signed electronically. ?Number of Addenda: 0 ?Note Initiated On: 09/17/2021 8:18 AM ?Scope Withdrawal Time: 0 hours 14 minutes 42 seconds  ?Total Procedure Duration: 0 hours 19 minutes 42 seconds  ?Estimated Blood Loss:  Estimated blood loss: none. ?     [facility] ?

## 2021-09-17 NOTE — H&P (Signed)
? ? ? ?Jonathon Bellows, MD ?[address], [place], [place], [place], [address] ?[address] [place], [place], [place], [address] ?Phone: [phone]  ?Fax: [phone] ? ?Primary Care Physician:  Glean Hess, MD ? ? ?Pre-Procedure History & Physical: ?HPI:  Charles Turner is a 61 y.o. male is here for an colonoscopy. ?  ?Past Medical History:  ?Diagnosis Date  ? Anxiety   ? High cholesterol   ? Prostate enlargement   ? ? ?History reviewed. No pertinent surgical history. ? ?Prior to Admission medications   ?Medication Sig Start Date End Date Taking? Authorizing Provider  ?atorvastatin (LIPITOR) 10 MG tablet TAKE 1 TABLET BY MOUTH EVERY DAY 08/24/21  Yes Glean Hess, MD  ?finasteride (PROSCAR) 5 MG tablet Take 1 tablet by mouth daily. 10/24/20  Yes [provider]  ?mometasone (ELOCON) 0.1 % ointment Apply topically 2 (two) times daily as needed. 05/28/21  Yes [provider]  ?Multiple Vitamin (MULTI VITAMIN PO) Take by mouth daily.   Yes [provider]  ?omeprazole (PRILOSEC) 20 MG capsule Take 1 capsule (20 mg total) by mouth daily. 05/29/21  Yes Margarette Canada, NP  ?PARoxetine (PAXIL-CR) 25 MG 24 hr tablet Take 1 tablet (25 mg total) by mouth daily. 08/06/21  Yes Glean Hess, MD  ?silodosin (RAPAFLO) 8 MG CAPS capsule Take 8 mg by mouth daily with breakfast.   Yes [provider]  ?Chlorpheniramine Maleate (ALLERGY PO) Take by mouth as needed.    [provider]  ?clobetasol cream (TEMOVATE) 5.57 % Apply 1 application topically 2 (two) times daily. 05/24/21   [provider]  ?EPINEPHrine 0.3 mg/0.3 mL IJ SOAJ injection Inject 0.3 mLs into the skin as needed. 12/10/20   [provider]  ?tadalafil (CIALIS) 20 MG tablet Take 1 tablet (20 mg total) by mouth daily as needed for erectile dysfunction. 05/17/21   Debroah Loop, PA-C  ? ? ?Allergies as of 06/24/2021 - Review Complete 06/24/2021  ?Allergen Reaction Noted  ? Bee  venom Anaphylaxis 01/04/2015  ? Wasp venom Anaphylaxis 01/04/2015  ? ? ?Family History  ?Adopted: Yes  ?Family history unknown: Yes  ? ? ?Social History  ? ?Socioeconomic History  ? Marital status: Married  ?  Spouse name: Not on file  ? Number of children: Not on file  ? Years of education: Not on file  ? Highest education level: Not on file  ?Occupational History  ? Not on file  ?Tobacco Use  ? Smoking status: Never  ? Smokeless tobacco: Former  ?  Types: Chew  ?Vaping Use  ? Vaping Use: Never used  ?Substance and Sexual Activity  ? Alcohol use: No  ?  Alcohol/week: 0.0 standard drinks  ? Drug use: No  ? Sexual activity: Yes  ?Other Topics Concern  ? Not on file  ?Social History Narrative  ? Not on file  ? ?Social Determinants of Health  ? ?Financial Resource Strain: Not on file  ?Food Insecurity: Not on file  ?Transportation Needs: Not on file  ?Physical Activity: Not on file  ?Stress: Not on file  ?Social Connections: Not on file  ?Intimate Partner Violence: Not on file  ? ? ?Review of Systems: ?See HPI, otherwise negative ROS ? ?Physical Exam: ?BP 124/77   Pulse 81   Temp (!) 96.5 ?F (35.8 ?C) (Temporal)   Resp 18   Ht 6' (1.829 m)   Wt 100.9 kg   SpO2 97%   BMI 30.17 kg/m?  ?  General:   Alert,  pleasant and cooperative in NAD ?Head:  Normocephalic and atraumatic. ?Neck:  Supple; no masses or thyromegaly. ?Lungs:  Clear throughout to auscultation, normal respiratory effort.    ?Heart:  +S1, +S2, Regular rate and rhythm, No edema. ?Abdomen:  Soft, nontender and nondistended. Normal bowel sounds, without guarding, and without rebound.   ?Neurologic:  Alert and  oriented x4;  grossly normal neurologically. ? ?Impression/Plan: ?Charles Turner is here for an colonoscopy to be performed for blood in stool ?Risks, benefits, limitations, and alternatives regarding  colonoscopy have been reviewed with the patient.  Questions have been answered.  All parties agreeable. ? ? ?Jonathon Bellows, MD  09/17/2021, 8:12 AM ? ?

## 2021-09-17 NOTE — Anesthesia Procedure Notes (Signed)
Date/Time: 09/17/2021 8:19 AM ?Performed by: Demetrius Charity, CRNA ?Pre-anesthesia Checklist: Patient identified, Emergency Drugs available, Suction available, Patient being monitored and Timeout performed ?Patient Re-evaluated:Patient Re-evaluated prior to induction ?Oxygen Delivery Method: Nasal cannula ?Induction Type: IV induction ?Placement Confirmation: CO2 detector and positive ETCO2 ? ? ? ? ?

## 2021-09-17 NOTE — Anesthesia Preprocedure Evaluation (Addendum)
Anesthesia Evaluation  ?Patient identified by MRN, date of birth, ID band ?Patient awake ? ? ? ?Reviewed: ?Allergy & Precautions, H&P , NPO status , Patient's Chart, lab work & pertinent test results, reviewed documented beta blocker date and time  ? ?History of Anesthesia Complications ?Negative for: history of anesthetic complications ? ?Airway ?Mallampati: I ? ?TM Distance: >3 FB ?Neck ROM: full ? ? ? Dental ? ?(+) Dental Advidsory Given, Caps, Teeth Intact ?  ?Pulmonary ?neg pulmonary ROS,  ?  ?Pulmonary exam normal ?breath sounds clear to auscultation ? ? ? ? ? ? Cardiovascular ?Exercise Tolerance: Good ?negative cardio ROS ?Normal cardiovascular exam ?Rhythm:regular Rate:Normal ? ? ?  ?Neuro/Psych ?negative neurological ROS ? negative psych ROS  ? GI/Hepatic ?negative GI ROS, Neg liver ROS,   ?Endo/Other  ?negative endocrine ROS ? Renal/GU ?negative Renal ROS  ?negative genitourinary ?  ?Musculoskeletal ? ? Abdominal ?  ?Peds ? Hematology ?negative hematology ROS ?(+)   ?Anesthesia Other Findings ?Past Medical History: ?No date: Anxiety ?No date: High cholesterol ?No date: Prostate enlargement ? ? Reproductive/Obstetrics ?negative OB ROS ? ?  ? ? ? ? ? ? ? ? ? ? ? ? ? ?  ?  ? ? ? ? ? ? ? ? ?Anesthesia Physical ?Anesthesia Plan ? ?ASA: 1 ? ?Anesthesia Plan: General  ? ?Post-op Pain Management:   ? ?Induction: Intravenous ? ?PONV Risk Score and Plan: 2 and Propofol infusion and TIVA ? ?Airway Management Planned: Natural Airway and Nasal Cannula ? ?Additional Equipment:  ? ?Intra-op Plan:  ? ?Post-operative Plan:  ? ?Informed Consent: I have reviewed the patients History and Physical, chart, labs and discussed the procedure including the risks, benefits and alternatives for the proposed anesthesia with the patient or authorized representative who has indicated his/her understanding and acceptance.  ? ? ? ?Dental Advisory Given ? ?Plan Discussed with: Anesthesiologist, CRNA and  Surgeon ? ?Anesthesia Plan Comments:   ? ? ? ? ? ?Anesthesia Quick Evaluation ? ?

## 2021-09-17 NOTE — Anesthesia Postprocedure Evaluation (Signed)
Anesthesia Post Note ? ?Patient: Charles Turner ? ?Procedure(s) Performed: COLONOSCOPY WITH PROPOFOL ? ?Patient location during evaluation: Endoscopy ?Anesthesia Type: General ?Level of consciousness: awake and alert ?Pain management: pain level controlled ?Vital Signs Assessment: post-procedure vital signs reviewed and stable ?Respiratory status: spontaneous breathing, nonlabored ventilation, respiratory function stable and patient connected to nasal cannula oxygen ?Cardiovascular status: blood pressure returned to baseline and stable ?Postop Assessment: no apparent nausea or vomiting ?Anesthetic complications: no ? ? ?No notable events documented. ? ? ?Last Vitals:  ?Vitals:  ? 09/17/21 0858 09/17/21 0908  ?BP: 110/70 131/75  ?Pulse: 68 (!) 59  ?Resp: 18 15  ?Temp:    ?SpO2: 99% 100%  ?  ?Last Pain:  ?Vitals:  ? 09/17/21 0908  ?TempSrc:   ?PainSc: 0-No pain  ? ? ?  ?  ?  ?  ?  ?  ? ?Martha Clan ? ? ? ? ?

## 2021-09-17 NOTE — Transfer of Care (Signed)
Immediate Anesthesia Transfer of Care Note ? ?Patient: Charles Turner ? ?Procedure(s) Performed: COLONOSCOPY WITH PROPOFOL ? ?Patient Location: PACU ? ?Anesthesia Type:General ? ?Level of Consciousness: drowsy ? ?Airway & Oxygen Therapy: Patient Spontanous Breathing ? ?Post-op Assessment: Report given to RN and Post -op Vital signs reviewed and stable ? ?Post vital signs: Reviewed and stable ? ?Last Vitals:  ?Vitals Value Taken Time  ?BP    ?Temp    ?Pulse    ?Resp    ?SpO2    ? ? ?Last Pain:  ?Vitals:  ? 09/17/21 0737  ?TempSrc: Temporal  ?PainSc: 0-No pain  ?   ? ?  ? ?Complications: No notable events documented. ?

## 2021-09-20 ENCOUNTER — Encounter: Payer: Self-pay | Admitting: Gastroenterology

## 2021-09-20 LAB — SURGICAL PATHOLOGY

## 2021-11-20 ENCOUNTER — Other Ambulatory Visit: Payer: Self-pay | Admitting: Internal Medicine

## 2021-11-20 DIAGNOSIS — E782 Mixed hyperlipidemia: Secondary | ICD-10-CM

## 2021-11-25 DIAGNOSIS — Z86018 Personal history of other benign neoplasm: Secondary | ICD-10-CM | POA: Diagnosis not present

## 2021-11-25 DIAGNOSIS — L578 Other skin changes due to chronic exposure to nonionizing radiation: Secondary | ICD-10-CM | POA: Diagnosis not present

## 2021-11-25 DIAGNOSIS — Z859 Personal history of malignant neoplasm, unspecified: Secondary | ICD-10-CM | POA: Diagnosis not present

## 2021-11-25 DIAGNOSIS — L281 Prurigo nodularis: Secondary | ICD-10-CM | POA: Diagnosis not present

## 2021-12-23 ENCOUNTER — Other Ambulatory Visit: Payer: Self-pay | Admitting: Internal Medicine

## 2021-12-23 NOTE — Telephone Encounter (Signed)
Medication Refill - Medication: EPINEPHrine 0.3 mg/0.3 mL IJ SOAJ injection.    Has the patient contacted their pharmacy? No.  (Agent: If no, request that the patient contact the pharmacy for the refill. If patient does not wish to contact the pharmacy document the reason why and proceed with request.) Patient states prescription has expired and would like PCP to send in 2 refills   Preferred Pharmacy (with phone number or street name):  [id number] [place] [address]:  [phone] Fax:  [phone]     Has the patient been seen for an appointment in the last year OR does the patient have an upcoming appointment? Yes.    Agent: Please be advised that RX refills may take up to 3 business days. We ask that you follow-up with your pharmacy.

## 2021-12-24 MED ORDER — EPINEPHRINE 0.3 MG/0.3ML IJ SOAJ: 0.3000 mg | INTRAMUSCULAR | 0 refills | Status: DC | PRN

## 2021-12-24 NOTE — Telephone Encounter (Signed)
Requested medication (s) are due for refill today: yes  Requested medication (s) are on the active medication list: yes  Last refill:  12/10/21  Future visit scheduled: yes  Notes to clinic:  Unable to refill per protocol, last refill by another provider.  Historical provider, routing for approval.     Requested Prescriptions  Pending Prescriptions Disp Refills   EPINEPHrine 0.3 mg/0.3 mL IJ SOAJ injection 1 each     Sig: Inject 0.3 mg into the skin as needed.     Immunology: Antidotes Passed - 12/23/2021 12:50 PM      Passed - Valid encounter within last 12 months    Recent Outpatient Visits           4 months ago Annual physical exam   [facility] Glean Hess, MD   1 year ago Annual physical exam   [facility] Glean Hess, MD   2 years ago Annual physical exam   [facility] Glean Hess, MD   3 years ago Annual physical exam   [facility] Glean Hess, MD   4 years ago Annual physical exam   [facility] Glean Hess, MD       Future Appointments             In 4 months Luana Shu [place]   In 7 months Army Melia, Jesse Sans, MD [facility] [place]

## 2022-04-05 ENCOUNTER — Other Ambulatory Visit: Payer: Self-pay | Admitting: Internal Medicine

## 2022-04-05 MED ORDER — EPINEPHRINE 0.3 MG/0.3ML IJ SOAJ: 0.3000 mg | INTRAMUSCULAR | 0 refills | Status: DC | PRN

## 2022-04-05 NOTE — Telephone Encounter (Signed)
Copied from [place] [id number]. Topic: General - Other >> Apr 05, 2022 10:09 AM Everette C wrote: Reason for CRM: Medication Refill - Medication: EPINEPHrine 0.3 mg/0.3 mL IJ SOAJ injection [[id number]]  Has the patient contacted their pharmacy? No. The patient did not know the code for requesting the refill  (Agent: If no, request that the patient contact the pharmacy for the refill. If patient does not wish to contact the pharmacy document the reason why and proceed with request.) (Agent: If yes, when and what did the pharmacy advise?)  Preferred Pharmacy (with phone number or street name): [id number] [place]: [phone]: [phone]: Not open 24 hours   Has the patient been seen for an appointment in the last year OR does the patient have an upcoming appointment? Yes.    Agent: Please be advised that RX refills may take up to 3 business days. We ask that you follow-up with your pharmacy.

## 2022-04-05 NOTE — Telephone Encounter (Signed)
Requested Prescriptions  Pending Prescriptions Disp Refills   EPINEPHrine 0.3 mg/0.3 mL IJ SOAJ injection 1 each 0    Sig: Inject 0.3 mg into the skin as needed.     Immunology: Antidotes Passed - 04/05/2022 10:17 AM      Passed - Valid encounter within last 12 months    Recent Outpatient Visits           8 months ago Annual physical exam   [place] [facility], Jesse Sans, MD   1 year ago Annual physical exam   [place] [facility] at [facility], Jesse Sans, MD   2 years ago Annual physical exam   [facility] at [facility], Jesse Sans, MD   4 years ago Annual physical exam   [facility] at [facility], Jesse Sans, MD   5 years ago Annual physical exam   [facility] at [facility], Jesse Sans, MD       Future Appointments             In 1 month Luana Shu [facility] [place]   In 4 months Army Melia, Jesse Sans, MD [place] [facility] at [facility]

## 2022-05-04 DIAGNOSIS — Z859 Personal history of malignant neoplasm, unspecified: Secondary | ICD-10-CM | POA: Diagnosis not present

## 2022-05-04 DIAGNOSIS — C44519 Basal cell carcinoma of skin of other part of trunk: Secondary | ICD-10-CM | POA: Diagnosis not present

## 2022-05-04 DIAGNOSIS — L281 Prurigo nodularis: Secondary | ICD-10-CM | POA: Diagnosis not present

## 2022-05-04 DIAGNOSIS — L578 Other skin changes due to chronic exposure to nonionizing radiation: Secondary | ICD-10-CM | POA: Diagnosis not present

## 2022-05-04 DIAGNOSIS — D485 Neoplasm of uncertain behavior of skin: Secondary | ICD-10-CM | POA: Diagnosis not present

## 2022-05-04 DIAGNOSIS — L570 Actinic keratosis: Secondary | ICD-10-CM | POA: Diagnosis not present

## 2022-05-04 DIAGNOSIS — Z86018 Personal history of other benign neoplasm: Secondary | ICD-10-CM | POA: Diagnosis not present

## 2022-05-12 ENCOUNTER — Other Ambulatory Visit: Payer: Self-pay | Admitting: *Deleted

## 2022-05-12 DIAGNOSIS — N138 Other obstructive and reflux uropathy: Secondary | ICD-10-CM

## 2022-05-13 ENCOUNTER — Other Ambulatory Visit: Admission: RE | Admit: 2022-05-13 | Payer: BC Managed Care – PPO | Source: Home / Self Care

## 2022-05-13 ENCOUNTER — Other Ambulatory Visit: Payer: BC Managed Care – PPO

## 2022-05-13 DIAGNOSIS — N138 Other obstructive and reflux uropathy: Secondary | ICD-10-CM

## 2022-05-13 DIAGNOSIS — N401 Enlarged prostate with lower urinary tract symptoms: Secondary | ICD-10-CM | POA: Diagnosis not present

## 2022-05-14 LAB — BASIC METABOLIC PANEL
BUN/Creatinine Ratio: 13 (ref 10–24)
BUN: 15 mg/dL (ref 8–27)
CO2: 25 mmol/L (ref 20–29)
Calcium: 10.3 mg/dL — ABNORMAL HIGH (ref 8.6–10.2)
Chloride: 98 mmol/L (ref 96–106)
Creatinine, Ser: 1.16 mg/dL (ref 0.76–1.27)
Glucose: 111 mg/dL — ABNORMAL HIGH (ref 70–99)
Potassium: 4.9 mmol/L (ref 3.5–5.2)
Sodium: 141 mmol/L (ref 134–144)
eGFR: 72 mL/min/{1.73_m2} (ref >59)

## 2022-05-14 LAB — PSA: Prostate Specific Ag, Serum: 2 ng/mL (ref 0.0–4.0)

## 2022-05-16 ENCOUNTER — Ambulatory Visit (INDEPENDENT_AMBULATORY_CARE_PROVIDER_SITE_OTHER): Payer: BC Managed Care – PPO | Admitting: Physician Assistant

## 2022-05-16 ENCOUNTER — Encounter: Payer: Self-pay | Admitting: Physician Assistant

## 2022-05-16 VITALS — BP 124/78 | HR 67 | Ht 72.0 in | Wt 220.0 lb

## 2022-05-16 DIAGNOSIS — N529 Male erectile dysfunction, unspecified: Secondary | ICD-10-CM | POA: Diagnosis not present

## 2022-05-16 DIAGNOSIS — N138 Other obstructive and reflux uropathy: Secondary | ICD-10-CM | POA: Diagnosis not present

## 2022-05-16 DIAGNOSIS — N401 Enlarged prostate with lower urinary tract symptoms: Secondary | ICD-10-CM | POA: Diagnosis not present

## 2022-05-16 DIAGNOSIS — R3129 Other microscopic hematuria: Secondary | ICD-10-CM

## 2022-05-16 LAB — URINALYSIS, COMPLETE
Bilirubin, UA: NEGATIVE
Glucose, UA: NEGATIVE
Ketones, UA: NEGATIVE
Leukocytes,UA: NEGATIVE
Nitrite, UA: NEGATIVE
Protein,UA: NEGATIVE
RBC, UA: NEGATIVE
Specific Gravity, UA: 1.015 (ref 1.005–1.030)
Urobilinogen, Ur: 0.2 mg/dL (ref 0.2–1.0)
pH, UA: 6 (ref 5.0–7.5)

## 2022-05-16 LAB — MICROSCOPIC EXAMINATION

## 2022-05-16 LAB — BLADDER SCAN AMB NON-IMAGING: Scan Result: >436

## 2022-05-16 MED ORDER — FINASTERIDE 5 MG PO TABS: 5.0000 mg | ORAL_TABLET | Freq: Every day | ORAL | 11 refills | Status: DC

## 2022-05-16 MED ORDER — TADALAFIL 20 MG PO TABS: 20.0000 mg | ORAL_TABLET | Freq: Every day | ORAL | 2 refills | Status: DC | PRN

## 2022-05-16 MED ORDER — SILODOSIN 8 MG PO CAPS: 8.0000 mg | ORAL_CAPSULE | Freq: Every day | ORAL | 11 refills | Status: DC

## 2022-05-16 NOTE — Patient Instructions (Signed)
I have refilled your Rapaflo (silodosin), Proscar (finasteride), and Cialis (tadalafil) '20mg'$  for the year. Please find out if you are still taking the low dose daily Cialis (tadalafil-'5mg'$ ) and I will send this in for you as well.

## 2022-05-16 NOTE — Progress Notes (Signed)
05/16/2022 10:19 AM   Charles Turner 01/03/1961 [id number]  CC: Chief Complaint  Patient presents with   Benign Prostatic Hypertrophy   HPI: Charles Turner is a 61 y.o. male with PMH BPH with severe BOO who has declined outlet procedures, chronic urinary retention, bladder diverticulum, persistent microscopic hematuria who has declined hematuria workup, and ED who presents today for annual follow-up.   Today he reports no changes in his health status or voiding symptoms over the past year.  He is currently taking Rapaflo 8 mg daily, finasteride 5 mg daily, and tadalafil 20 mg demand dose for ED. He is unsure if he remains on tadalafil 5 mg daily, as his wife manages his medications.  IPSS 10/mostly satisfied as below, previously 16/mostly satisfied.  PSA history as below: 05/13/2022 2.0 05/11/2021 1.9 05/14/2020 2.1 09/05/2019 2.41 05/15/2019 3.3  Creatinine 1.16 3 days ago, baseline 1.0.  eGFR remains normal at 72, previously ~90.  In-office UA and microscopy today pan negative. PVR >480m, previously 584 mL.   IPSS     Row Name 05/16/22 1000         International Prostate Symptom Score   How often have you had the sensation of not emptying your bladder? Less than 1 in 5     How often have you had to urinate less than every two hours? Less than 1 in 5 times     How often have you found you stopped and started again several times when you urinated? Less than 1 in 5 times     How often have you found it difficult to postpone urination? Not at All     How often have you had a weak urinary stream? Less than half the time     How often have you had to strain to start urination? More than half the time     How many times did you typically get up at night to urinate? 1 Time     Total IPSS Score 10       Quality of Life due to urinary symptoms   If you were to spend the rest of your life with your urinary condition just the way it is now how would you feel about that?  Mostly Satisfied               PMH: Past Medical History:  Diagnosis Date   Anxiety    High cholesterol    Prostate enlargement     Surgical History: Past Surgical History:  Procedure Laterality Date   COLONOSCOPY WITH PROPOFOL N/A 09/17/2021   Procedure: COLONOSCOPY WITH PROPOFOL;  Surgeon: AJonathon Bellows MD;  [facility]  Service: Gastroenterology;  Laterality: N/A;    Home Medications:  Allergies as of 05/16/2022       Reactions   Bee Venom Anaphylaxis   Wasp Venom Anaphylaxis        Medication List        Accurate as of May 16, 2022 10:19 AM. If you have any questions, ask your nurse or doctor.          STOP taking these medications    ALLERGY PO Stopped by: SDebroah Loop PA-C   clobetasol cream 0.05 % Commonly known as: TEMOVATE Stopped by: SDebroah Loop PA-C   mometasone 0.1 % ointment Commonly known as: ELOCON Stopped by: SDebroah Loop PA-C       TAKE these medications    atorvastatin 10 MG tablet Commonly known as: LIPITOR TAKE 1  TABLET BY MOUTH EVERY DAY   EPINEPHrine 0.3 mg/0.3 mL Soaj injection Commonly known as: EPI-PEN Inject 0.3 mg into the skin as needed.   finasteride 5 MG tablet Commonly known as: PROSCAR Take 1 tablet by mouth daily.   MULTI VITAMIN PO Take by mouth daily.   omeprazole 20 MG capsule Commonly known as: PRILOSEC Take 1 capsule (20 mg total) by mouth daily.   PARoxetine 25 MG 24 hr tablet Commonly known as: PAXIL-CR Take 1 tablet (25 mg total) by mouth daily.   silodosin 8 MG Caps capsule Commonly known as: RAPAFLO Take 8 mg by mouth daily with breakfast.   tadalafil 20 MG tablet Commonly known as: CIALIS Take 1 tablet (20 mg total) by mouth daily as needed for erectile dysfunction.        Allergies:  Allergies  Allergen Reactions   Bee Venom Anaphylaxis   Wasp Venom Anaphylaxis    Family History: Family History  Adopted: Yes  Family  history unknown: Yes    Social History:   reports that he has never smoked. He has quit using smokeless tobacco.  His smokeless tobacco use included chew. He reports that he does not drink alcohol and does not use drugs.  Physical Exam: BP 124/78   Pulse 67   Ht 6' (1.829 m)   Wt 220 lb (99.8 kg)   BMI 29.84 kg/m   Constitutional:  Alert and oriented, no acute distress, nontoxic appearing HEENT: [place], AT Cardiovascular: No clubbing, cyanosis, or edema Respiratory: Normal respiratory effort, no increased work of breathing GU: Normal sphincter tone, smooth, symmetrically enlarged 40+ cc prostate without nodules or induration. Skin: No rashes, bruises or suspicious lesions Neurologic: Grossly intact, no focal deficits, moving all 4 extremities Psychiatric: Normal mood and affect  Laboratory Data: Results for orders placed or performed in visit on 05/16/22  Microscopic Examination   Urine  Result Value Ref Range   WBC, UA 0-5 0 - 5 /hpf   RBC, Urine 0-2 0 - 2 /hpf   Epithelial Cells (non renal) 0-10 0 - 10 /hpf   Bacteria, UA Few None seen/Few  Urinalysis, Complete  Result Value Ref Range   Specific Gravity, UA 1.015 1.005 - 1.030   pH, UA 6.0 5.0 - 7.5   Color, UA Yellow Yellow   Appearance Ur Clear Clear   Leukocytes,UA Negative Negative   Protein,UA Negative Negative/Trace   Glucose, UA Negative Negative   Ketones, UA Negative Negative   RBC, UA Negative Negative   Bilirubin, UA Negative Negative   Urobilinogen, Ur 0.2 0.2 - 1.0 mg/dL   Nitrite, UA Negative Negative   Microscopic Examination See below:   Bladder Scan (Post Void Residual) in office  Result Value Ref Range   Scan Result >436    Assessment & Plan:   1. Benign prostatic hyperplasia with urinary obstruction Stable urinary symptoms, PSA, and PVR.  Creatinine is slightly up over baseline, however remains WNL.  I offered him renal ultrasound to evaluate for hydronephrosis that would be concerning for upper  tract involvement of his chronic urinary retention, but he declined.  Will refill silodosin and finasteride for another year and continue to monitor.  I asked him to check with his wife to see if he is also still on low-dose daily tadalafil, will refill this if he remains on it. - Bladder Scan (Post Void Residual) in office - silodosin (RAPAFLO) 8 MG CAPS capsule; Take 1 capsule (8 mg total) by mouth daily with breakfast.  Dispense: 30 capsule; Refill: 11 - finasteride (PROSCAR) 5 MG tablet; Take 1 tablet (5 mg total) by mouth daily.  Dispense: 30 tablet; Refill: 11 - Basic metabolic panel; Future - PSA; Future  2. Microscopic hematuria None on annual UA today.  Will continue to monitor. - Urinalysis, Complete  3. Erectile dysfunction, unspecified erectile dysfunction type Adequate symptom control on tadalafil, will refill. - tadalafil (CIALIS) 20 MG tablet; Take 1 tablet (20 mg total) by mouth daily as needed for erectile dysfunction.  Dispense: 30 tablet; Refill: 2   Return in about 1 year (around 05/17/2023) for Annual DRE/IPSS/PVR/UA with PSA and BMP prior.  Debroah Loop, PA-C  [facility] [address] [place], [place] [address] [phone]

## 2022-05-27 ENCOUNTER — Other Ambulatory Visit: Payer: Self-pay | Admitting: Internal Medicine

## 2022-05-27 DIAGNOSIS — E782 Mixed hyperlipidemia: Secondary | ICD-10-CM

## 2022-05-27 DIAGNOSIS — F411 Generalized anxiety disorder: Secondary | ICD-10-CM

## 2022-08-09 ENCOUNTER — Encounter: Payer: BC Managed Care – PPO | Admitting: Internal Medicine

## 2022-08-16 ENCOUNTER — Ambulatory Visit (INDEPENDENT_AMBULATORY_CARE_PROVIDER_SITE_OTHER): Payer: BC Managed Care – PPO | Admitting: Internal Medicine

## 2022-08-16 ENCOUNTER — Encounter: Payer: Self-pay | Admitting: Internal Medicine

## 2022-08-16 VITALS — BP 118/82 | HR 68 | Ht 72.0 in | Wt 214.4 lb

## 2022-08-16 DIAGNOSIS — Z131 Encounter for screening for diabetes mellitus: Secondary | ICD-10-CM | POA: Diagnosis not present

## 2022-08-16 DIAGNOSIS — N401 Enlarged prostate with lower urinary tract symptoms: Secondary | ICD-10-CM

## 2022-08-16 DIAGNOSIS — F411 Generalized anxiety disorder: Secondary | ICD-10-CM | POA: Diagnosis not present

## 2022-08-16 DIAGNOSIS — E782 Mixed hyperlipidemia: Secondary | ICD-10-CM

## 2022-08-16 DIAGNOSIS — Z0000 Encounter for general adult medical examination without abnormal findings: Secondary | ICD-10-CM

## 2022-08-16 DIAGNOSIS — R338 Other retention of urine: Secondary | ICD-10-CM

## 2022-08-16 NOTE — Assessment & Plan Note (Addendum)
Treated by Urology with medications PSA being monitored by Urology

## 2022-08-16 NOTE — Progress Notes (Signed)
Date:  08/16/2022   Name:  Charles Turner   DOB:  22-May-1961   MRN:  [id number]   Chief Complaint: Annual Exam Charles Turner is a 62 y.o. male who presents today for his Complete Annual Exam. He feels well. He reports exercising. He reports he is sleeping well. He started a better exercise routine in January and has lost about 15 lbs.   Colonoscopy: 08/2021 TA - repeat 3 yrs  Immunization History  Administered Date(s) Administered   PFIZER(Purple Top)SARS-COV-2 Vaccination 09/19/2019, 10/15/2019   Health Maintenance Due  Topic Date Due   DTaP/Tdap/Td (1 - Tdap) Never done   COVID-19 Vaccine (3 - Pfizer risk series) 11/12/2019    Lab Results  Component Value Date   PSA1 2.0 05/13/2022   PSA1 1.9 05/11/2021   PSA1 2.1 05/14/2020    Hyperlipidemia This is a chronic problem. The problem is controlled. Pertinent negatives include no chest pain, myalgias or shortness of breath. Current antihyperlipidemic treatment includes statins. The current treatment provides significant improvement of lipids.  Anxiety Presents for follow-up visit. Patient reports no chest pain, dizziness, nervous/anxious behavior, palpitations or shortness of breath. Symptoms occur most days.   Compliance with medications is 76-100% (paxil).    Lab Results  Component Value Date   NA 141 05/13/2022   K 4.9 05/13/2022   CO2 25 05/13/2022   GLUCOSE 111 (H) 05/13/2022   BUN 15 05/13/2022   CREATININE 1.16 05/13/2022   CALCIUM 10.3 (H) 05/13/2022   EGFR 72 05/13/2022   GFRNONAA 84 07/31/2019   Lab Results  Component Value Date   CHOL 175 08/06/2021   HDL 55 08/06/2021   LDLCALC 99 08/06/2021   TRIG 119 08/06/2021   CHOLHDL 3.2 08/06/2021   Lab Results  Component Value Date   TSH 2.870 08/06/2021   No results found for: "HGBA1C" Lab Results  Component Value Date   WBC 6.1 08/06/2021   HGB 14.0 08/06/2021   HCT 41.5 08/06/2021   MCV 89 08/06/2021   PLT 319 08/06/2021   Lab Results   Component Value Date   ALT 28 08/06/2021   AST 22 08/06/2021   ALKPHOS 54 08/06/2021   BILITOT 0.6 08/06/2021   No results found for: "25OHVITD2", "25OHVITD3", "VD25OH"   Review of Systems  Constitutional:  Negative for appetite change, chills, diaphoresis, fatigue and unexpected weight change.  HENT:  Negative for hearing loss, tinnitus, trouble swallowing and voice change.   Eyes:  Negative for visual disturbance.  Respiratory:  Negative for choking, shortness of breath and wheezing.   Cardiovascular:  Negative for chest pain, palpitations and leg swelling.  Gastrointestinal:  Negative for abdominal pain, blood in stool, constipation and diarrhea.  Genitourinary:  Negative for difficulty urinating, dysuria and frequency.  Musculoskeletal:  Negative for arthralgias, back pain and myalgias.  Skin:  Negative for color change and rash.       Hx of Melanoma in situ and cold feet  Neurological:  Negative for dizziness, syncope and headaches.  Hematological:  Negative for adenopathy.  Psychiatric/Behavioral:  Negative for dysphoric mood and sleep disturbance. The patient is not nervous/anxious.     Patient Active Problem List   Diagnosis Date Noted   Squamous cell skin cancer, wrist, right 08/06/2021   Primary osteoarthritis of both knees 03/15/2016   Urinary retention due to benign prostatic hyperplasia 12/22/2015   Generalized anxiety disorder 06/17/2015   Bee sting allergy 06/17/2015   Mixed hyperlipidemia 05/05/2015   Bladder neoplasm  05/05/2015   Abnormal LFTs 05/05/2015   Failure of erection 05/05/2015    Allergies  Allergen Reactions   Bee Venom Anaphylaxis   Wasp Venom Anaphylaxis    Past Surgical History:  Procedure Laterality Date   COLONOSCOPY WITH PROPOFOL N/A 09/17/2021   Procedure: COLONOSCOPY WITH PROPOFOL;  Surgeon: Jonathon Bellows, MD;  Location: [facility] ENDOSCOPY;  Service: Gastroenterology;  Laterality: N/A;    Social History   Tobacco Use   Smoking status:  Never   Smokeless tobacco: Former    Types: Nurse, children's Use: Never used  Substance Use Topics   Alcohol use: No    Alcohol/week: 0.0 standard drinks of alcohol   Drug use: No     Medication list has been reviewed and updated.  Current Meds  Medication Sig   atorvastatin (LIPITOR) 10 MG tablet TAKE 1 TABLET BY MOUTH EVERY DAY   EPINEPHrine 0.3 mg/0.3 mL IJ SOAJ injection Inject 0.3 mg into the skin as needed.   finasteride (PROSCAR) 5 MG tablet Take 1 tablet (5 mg total) by mouth daily.   Multiple Vitamin (MULTI VITAMIN PO) Take by mouth daily.   PARoxetine (PAXIL-CR) 25 MG 24 hr tablet TAKE 1 TABLET (25 MG TOTAL) BY MOUTH DAILY.   silodosin (RAPAFLO) 8 MG CAPS capsule Take 1 capsule (8 mg total) by mouth daily with breakfast.   tadalafil (CIALIS) 20 MG tablet Take 1 tablet (20 mg total) by mouth daily as needed for erectile dysfunction.       08/16/2022   10:18 AM 08/06/2021    8:48 AM 08/03/2020    8:30 AM  GAD 7 : Generalized Anxiety Score  Nervous, Anxious, on Edge 0 0 0  Control/stop worrying 0 0 0  Worry too much - different things 0 0 0  Trouble relaxing 0 0 0  Restless 0 0 0  Easily annoyed or irritable 0 0 0  Afraid - awful might happen 0 0 0  Total GAD 7 Score 0 0 0  Anxiety Difficulty Not difficult at all Not difficult at all        08/16/2022   10:18 AM 08/06/2021    8:47 AM 08/03/2020    8:30 AM  Depression screen PHQ 2/9  Decreased Interest 0 0 0  Down, Depressed, Hopeless 0 0 0  PHQ - 2 Score 0 0 0  Altered sleeping 0 0 0  Tired, decreased energy 0 0 0  Change in appetite 0 0 0  Feeling bad or failure about yourself  0 0 0  Trouble concentrating 0 0 0  Moving slowly or fidgety/restless 0 0 0  Suicidal thoughts 0 0 0  PHQ-9 Score 0 0 0  Difficult doing work/chores Not difficult at all Not difficult at all     BP Readings from Last 3 Encounters:  08/16/22 118/82  05/16/22 124/78  09/17/21 131/75    Physical Exam Vitals and nursing  note reviewed.  Constitutional:      Appearance: Normal appearance. He is well-developed.  HENT:     Head: Normocephalic.     Right Ear: Tympanic membrane, ear canal and external ear normal.     Left Ear: Tympanic membrane, ear canal and external ear normal.     Nose: Nose normal.  Eyes:     Conjunctiva/sclera: Conjunctivae normal.     Pupils: Pupils are equal, round, and reactive to light.  Neck:     Thyroid: No thyromegaly.     Vascular: No  carotid bruit.  Cardiovascular:     Rate and Rhythm: Normal rate and regular rhythm.     Heart sounds: Normal heart sounds.  Pulmonary:     Effort: Pulmonary effort is normal.     Breath sounds: Normal breath sounds. No wheezing.  Chest:  Breasts:    Right: No mass.     Left: No mass.  Abdominal:     General: Bowel sounds are normal.     Palpations: Abdomen is soft.     Tenderness: There is no abdominal tenderness.  Musculoskeletal:        General: Normal range of motion.     Cervical back: Normal range of motion and neck supple.     Right lower leg: No edema.     Left lower leg: No edema.  Lymphadenopathy:     Cervical: No cervical adenopathy.  Skin:    General: Skin is warm and dry.     Capillary Refill: Capillary refill takes less than 2 seconds.  Neurological:     General: No focal deficit present.     Mental Status: He is alert and oriented to person, place, and time.     Deep Tendon Reflexes: Reflexes are normal and symmetric.  Psychiatric:        Attention and Perception: Attention normal.        Mood and Affect: Mood normal.        Thought Content: Thought content normal.     Wt Readings from Last 3 Encounters:  08/16/22 214 lb 6.4 oz (97.3 kg)  05/16/22 220 lb (99.8 kg)  09/17/21 222 lb 7.1 oz (100.9 kg)    BP 118/82   Pulse 68   Ht 6' (1.829 m)   Wt 214 lb 6.4 oz (97.3 kg)   SpO2 96%   BMI 29.08 kg/m   Assessment and Plan:  Problem List Items Addressed This Visit       Genitourinary   Urinary  retention due to benign prostatic hyperplasia    Treated by Urology with medications PSA being monitored by Urology        Other   Generalized anxiety disorder    Clinically stable on current regimen with good control of symptoms, No SI or HI. No change in management at this time with Paxil.       Relevant Orders   TSH   Mixed hyperlipidemia    Diet controlled initially Now on atorvastatin without side effects      Relevant Orders   Lipid panel   Other Visit Diagnoses     Annual physical exam    -  Primary   hx of dysplastic nevus vs Melanoma in situ recommend Dermatology follow up   Relevant Orders   CBC with Differential/Platelet   Comprehensive metabolic panel   Hemoglobin A1c   Lipid panel   Screening for diabetes mellitus       Relevant Orders   Comprehensive metabolic panel   Hemoglobin A1c       No follow-ups on file.   Partially dictated using Bland, any errors are not intentional.  Glean Hess, MD [place], [place]

## 2022-08-16 NOTE — Assessment & Plan Note (Signed)
Clinically stable on current regimen with good control of symptoms, No SI or HI. No change in management at this time with Paxil.

## 2022-08-16 NOTE — Assessment & Plan Note (Signed)
Diet controlled initially Now on atorvastatin without side effects

## 2022-08-17 ENCOUNTER — Ambulatory Visit: Payer: Self-pay | Admitting: *Deleted

## 2022-08-17 LAB — HEMOGLOBIN A1C
Est. average glucose Bld gHb Est-mCnc: 126 mg/dL
Hgb A1c MFr Bld: 6 % — ABNORMAL HIGH (ref 4.8–5.6)

## 2022-08-17 LAB — CBC WITH DIFFERENTIAL/PLATELET
Basophils Absolute: 0 10*3/uL (ref 0.0–0.2)
Basos: 1 %
EOS (ABSOLUTE): 0.1 10*3/uL (ref 0.0–0.4)
Eos: 1 %
Hematocrit: 43 % (ref 37.5–51.0)
Hemoglobin: 14.1 g/dL (ref 13.0–17.7)
Immature Grans (Abs): 0 10*3/uL (ref 0.0–0.1)
Immature Granulocytes: 0 %
Lymphocytes Absolute: 2.4 10*3/uL (ref 0.7–3.1)
Lymphs: 40 %
MCH: 29.1 pg (ref 26.6–33.0)
MCHC: 32.8 g/dL (ref 31.5–35.7)
MCV: 89 fL (ref 79–97)
Monocytes Absolute: 0.4 10*3/uL (ref 0.1–0.9)
Monocytes: 6 %
Neutrophils Absolute: 3.1 10*3/uL (ref 1.4–7.0)
Neutrophils: 52 %
Platelets: 293 10*3/uL (ref 150–450)
RBC: 4.85 x10E6/uL (ref 4.14–5.80)
RDW: 12.5 % (ref 11.6–15.4)
WBC: 5.9 10*3/uL (ref 3.4–10.8)

## 2022-08-17 LAB — COMPREHENSIVE METABOLIC PANEL
ALT: 26 IU/L (ref 0–44)
AST: 29 IU/L (ref 0–40)
Albumin/Globulin Ratio: 2.1 (ref 1.2–2.2)
Albumin: 4.6 g/dL (ref 3.8–4.9)
Alkaline Phosphatase: 57 IU/L (ref 44–121)
BUN/Creatinine Ratio: 13 (ref 10–24)
BUN: 13 mg/dL (ref 8–27)
Bilirubin Total: 0.5 mg/dL (ref 0.0–1.2)
CO2: 24 mmol/L (ref 20–29)
Calcium: 9.8 mg/dL (ref 8.6–10.2)
Chloride: 103 mmol/L (ref 96–106)
Creatinine, Ser: 1.04 mg/dL (ref 0.76–1.27)
Globulin, Total: 2.2 g/dL (ref 1.5–4.5)
Glucose: 103 mg/dL — ABNORMAL HIGH (ref 70–99)
Potassium: 4.4 mmol/L (ref 3.5–5.2)
Sodium: 142 mmol/L (ref 134–144)
Total Protein: 6.8 g/dL (ref 6.0–8.5)
eGFR: 82 mL/min/{1.73_m2} (ref >59)

## 2022-08-17 LAB — LIPID PANEL
Chol/HDL Ratio: 2.8 ratio (ref 0.0–5.0)
Cholesterol, Total: 171 mg/dL (ref 100–199)
HDL: 61 mg/dL (ref >39)
LDL Chol Calc (NIH): 94 mg/dL (ref 0–99)
Triglycerides: 89 mg/dL (ref 0–149)
VLDL Cholesterol Cal: 16 mg/dL (ref 5–40)

## 2022-08-17 LAB — TSH: TSH: 2.39 u[IU]/mL (ref 0.450–4.500)

## 2022-08-17 NOTE — Telephone Encounter (Signed)
See result notes. 

## 2022-08-17 NOTE — Progress Notes (Signed)
Attempted to reach pt, left VM to call back

## 2022-08-17 NOTE — Telephone Encounter (Signed)
Attempted to reach pt, let VM to call back for lab results.

## 2022-10-05 ENCOUNTER — Other Ambulatory Visit: Payer: Self-pay

## 2022-10-05 DIAGNOSIS — Z1211 Encounter for screening for malignant neoplasm of colon: Secondary | ICD-10-CM

## 2022-10-11 ENCOUNTER — Encounter: Payer: Self-pay | Admitting: Internal Medicine

## 2022-11-09 DIAGNOSIS — Z1211 Encounter for screening for malignant neoplasm of colon: Secondary | ICD-10-CM | POA: Diagnosis not present

## 2022-11-15 LAB — COLOGUARD: COLOGUARD: NEGATIVE

## 2022-11-17 ENCOUNTER — Encounter: Payer: Self-pay | Admitting: Internal Medicine

## 2022-11-17 NOTE — Telephone Encounter (Signed)
Please review.  KP

## 2022-12-05 ENCOUNTER — Encounter: Payer: Self-pay | Admitting: Internal Medicine

## 2022-12-05 ENCOUNTER — Ambulatory Visit (INDEPENDENT_AMBULATORY_CARE_PROVIDER_SITE_OTHER): Payer: BC Managed Care – PPO | Admitting: Internal Medicine

## 2022-12-05 VITALS — BP 124/70 | HR 63 | Ht 72.0 in | Wt 209.0 lb

## 2022-12-05 DIAGNOSIS — F411 Generalized anxiety disorder: Secondary | ICD-10-CM

## 2022-12-05 MED ORDER — ALPRAZOLAM 0.25 MG PO TABS: 0.2500 mg | ORAL_TABLET | Freq: Every day | ORAL | 2 refills | Status: DC | PRN

## 2022-12-05 NOTE — Progress Notes (Signed)
Date:  12/05/2022   Name:  Charles Turner   DOB:  Dec 22, 1960   MRN:  [id number]   Chief Complaint: Anxiety (Wants to discuss if need to increase paxil. The last 3-4 weeks not feeling as well as normal. Taking Xanax from his wife 0.25 mg once daily as needed for anxiety. It helps patient to "settle and focus.")  Anxiety Presents for follow-up visit. Symptoms include depressed mood, excessive worry, irritability, nervous/anxious behavior, palpitations and restlessness. Patient reports no chest pain, dizziness, shortness of breath or suicidal ideas. Symptoms occur most days. The severity of symptoms is moderate.   Compliance with medications is 76-100%.  He has noticed more anxiety lately for no obvious reason.  He recently tried a few of his wife's xanax 0.25 mg and it has helped.  He continues to take the same dose of Paxil 25 mg.  Lab Results  Component Value Date   NA 142 08/16/2022   K 4.4 08/16/2022   CO2 24 08/16/2022   GLUCOSE 103 (H) 08/16/2022   BUN 13 08/16/2022   CREATININE 1.04 08/16/2022   CALCIUM 9.8 08/16/2022   EGFR 82 08/16/2022   GFRNONAA 84 07/31/2019   Lab Results  Component Value Date   CHOL 171 08/16/2022   HDL 61 08/16/2022   LDLCALC 94 08/16/2022   TRIG 89 08/16/2022   CHOLHDL 2.8 08/16/2022   Lab Results  Component Value Date   TSH 2.390 08/16/2022   Lab Results  Component Value Date   HGBA1C 6.0 (H) 08/16/2022   Lab Results  Component Value Date   WBC 5.9 08/16/2022   HGB 14.1 08/16/2022   HCT 43.0 08/16/2022   MCV 89 08/16/2022   PLT 293 08/16/2022   Lab Results  Component Value Date   ALT 26 08/16/2022   AST 29 08/16/2022   ALKPHOS 57 08/16/2022   BILITOT 0.5 08/16/2022   No results found for: "25OHVITD2", "25OHVITD3", "VD25OH"   Review of Systems  Constitutional:  Positive for irritability. Negative for chills, fatigue and fever.  HENT:  Negative for trouble swallowing.   Respiratory:  Negative for chest tightness and  shortness of breath.   Cardiovascular:  Positive for palpitations. Negative for chest pain.  Neurological:  Negative for dizziness, light-headedness and headaches.  Psychiatric/Behavioral:  Positive for dysphoric mood. Negative for agitation, sleep disturbance and suicidal ideas. The patient is nervous/anxious.     Patient Active Problem List   Diagnosis Date Noted   Squamous cell skin cancer, wrist, right 08/06/2021   Primary osteoarthritis of both knees 03/15/2016   Urinary retention due to benign prostatic hyperplasia 12/22/2015   Generalized anxiety disorder 06/17/2015   Bee sting allergy 06/17/2015   Mixed hyperlipidemia 05/05/2015   Bladder neoplasm 05/05/2015   Abnormal LFTs 05/05/2015   Failure of erection 05/05/2015    Allergies  Allergen Reactions   Bee Venom Anaphylaxis   Wasp Venom Anaphylaxis    Past Surgical History:  Procedure Laterality Date   COLONOSCOPY WITH PROPOFOL N/A 09/17/2021   Procedure: COLONOSCOPY WITH PROPOFOL;  Surgeon: Wyline Mood, MD;  Location: [facility] ENDOSCOPY;  Service: Gastroenterology;  Laterality: N/A;    Social History   Tobacco Use   Smoking status: Never   Smokeless tobacco: Former    Types: Associate Professor Use: Never used  Substance Use Topics   Alcohol use: No    Alcohol/week: 0.0 standard drinks of alcohol   Drug use: No     Medication list  has been reviewed and updated.  Current Meds  Medication Sig   ALPRAZolam (XANAX) 0.25 MG tablet Take 1 tablet (0.25 mg total) by mouth daily as needed for anxiety.   atorvastatin (LIPITOR) 10 MG tablet TAKE 1 TABLET BY MOUTH EVERY DAY   EPINEPHrine 0.3 mg/0.3 mL IJ SOAJ injection Inject 0.3 mg into the skin as needed.   finasteride (PROSCAR) 5 MG tablet Take 1 tablet (5 mg total) by mouth daily.   Multiple Vitamin (MULTI VITAMIN PO) Take by mouth daily.   PARoxetine (PAXIL-CR) 25 MG 24 hr tablet TAKE 1 TABLET (25 MG TOTAL) BY MOUTH DAILY.   silodosin (RAPAFLO) 8 MG CAPS  capsule Take 1 capsule (8 mg total) by mouth daily with breakfast.   tadalafil (CIALIS) 20 MG tablet Take 1 tablet (20 mg total) by mouth daily as needed for erectile dysfunction.       12/05/2022   10:23 AM 08/16/2022   10:18 AM 08/06/2021    8:48 AM 08/03/2020    8:30 AM  GAD 7 : Generalized Anxiety Score  Nervous, Anxious, on Edge 3 0 0 0  Control/stop worrying 1 0 0 0  Worry too much - different things 1 0 0 0  Trouble relaxing 1 0 0 0  Restless 2 0 0 0  Easily annoyed or irritable 2 0 0 0  Afraid - awful might happen 2 0 0 0  Total GAD 7 Score 12 0 0 0  Anxiety Difficulty Somewhat difficult Not difficult at all Not difficult at all        12/05/2022   10:23 AM 08/16/2022   10:18 AM 08/06/2021    8:47 AM  Depression screen PHQ 2/9  Decreased Interest 0 0 0  Down, Depressed, Hopeless 2 0 0  PHQ - 2 Score 2 0 0  Altered sleeping 0 0 0  Tired, decreased energy 0 0 0  Change in appetite 0 0 0  Feeling bad or failure about yourself  0 0 0  Trouble concentrating 0 0 0  Moving slowly or fidgety/restless 3 0 0  Suicidal thoughts 0 0 0  PHQ-9 Score 5 0 0  Difficult doing work/chores Somewhat difficult Not difficult at all Not difficult at all    BP Readings from Last 3 Encounters:  12/05/22 124/70  08/16/22 118/82  05/16/22 124/78    Physical Exam Vitals and nursing note reviewed.  Constitutional:      General: He is not in acute distress.    Appearance: Normal appearance. He is well-developed.  HENT:     Head: Normocephalic and atraumatic.  Cardiovascular:     Rate and Rhythm: Normal rate and regular rhythm.  Pulmonary:     Effort: Pulmonary effort is normal. No respiratory distress.     Breath sounds: No wheezing or rhonchi.  Musculoskeletal:     Right lower leg: No edema.     Left lower leg: No edema.  Skin:    General: Skin is warm and dry.     Findings: No rash.  Neurological:     General: No focal deficit present.     Mental Status: He is alert and oriented  to person, place, and time.  Psychiatric:        Mood and Affect: Mood normal.        Behavior: Behavior normal.     Wt Readings from Last 3 Encounters:  12/05/22 209 lb (94.8 kg)  08/16/22 214 lb 6.4 oz (97.3 kg)  05/16/22 220  lb (99.8 kg)    BP 124/70   Pulse 63   Ht 6' (1.829 m)   Wt 209 lb (94.8 kg)   SpO2 97%   BMI 28.35 kg/m   Assessment and Plan:  Problem List Items Addressed This Visit     Generalized anxiety disorder - Primary    Having more anxiety recently despite Paxil. He has tried low dose Xanax 0.25 q AM for several doses. He can continue 0.25 mg once a day PRN - if he feels the need to increase the dose of if it becomes ineffective, he will return for consideration of other medications or paxil dose increase. He can take 2 0.25 mg xanax PRN air travel.      Relevant Medications   ALPRAZolam (XANAX) 0.25 MG tablet    No follow-ups on file.   Partially dictated using Dragon software, any errors are not intentional.  Reubin Milan, MD [facility] [place], [place]

## 2022-12-05 NOTE — Assessment & Plan Note (Addendum)
Having more anxiety recently despite Paxil. He has tried low dose Xanax 0.25 q AM for several doses. He can continue 0.25 mg once a day PRN - if he feels the need to increase the dose of if it becomes ineffective, he will return for consideration of other medications or paxil dose increase. He can take 2 0.25 mg xanax PRN air travel.

## 2022-12-07 ENCOUNTER — Encounter: Payer: Self-pay | Admitting: Internal Medicine

## 2022-12-07 NOTE — Telephone Encounter (Signed)
Please review.  KP

## 2022-12-19 ENCOUNTER — Other Ambulatory Visit: Payer: Self-pay | Admitting: Physician Assistant

## 2022-12-19 DIAGNOSIS — N401 Enlarged prostate with lower urinary tract symptoms: Secondary | ICD-10-CM

## 2023-02-06 ENCOUNTER — Encounter: Payer: Self-pay | Admitting: Internal Medicine

## 2023-02-06 ENCOUNTER — Other Ambulatory Visit: Payer: Self-pay | Admitting: Internal Medicine

## 2023-02-06 DIAGNOSIS — F411 Generalized anxiety disorder: Secondary | ICD-10-CM

## 2023-02-06 MED ORDER — ALPRAZOLAM 0.25 MG PO TABS: 0.2500 mg | ORAL_TABLET | Freq: Every day | ORAL | 2 refills | Status: DC | PRN

## 2023-02-07 NOTE — Telephone Encounter (Signed)
Pt response.  KP

## 2023-02-27 ENCOUNTER — Other Ambulatory Visit: Payer: Self-pay | Admitting: Internal Medicine

## 2023-02-27 DIAGNOSIS — E782 Mixed hyperlipidemia: Secondary | ICD-10-CM

## 2023-02-27 NOTE — Telephone Encounter (Signed)
Please review.  KP

## 2023-05-05 ENCOUNTER — Other Ambulatory Visit: Payer: Self-pay | Admitting: Internal Medicine

## 2023-05-05 ENCOUNTER — Other Ambulatory Visit: Payer: Self-pay | Admitting: Physician Assistant

## 2023-05-05 DIAGNOSIS — F411 Generalized anxiety disorder: Secondary | ICD-10-CM

## 2023-05-05 DIAGNOSIS — N138 Other obstructive and reflux uropathy: Secondary | ICD-10-CM

## 2023-05-08 NOTE — Telephone Encounter (Signed)
Requested Prescriptions  Pending Prescriptions Disp Refills   PARoxetine (PAXIL-CR) 25 MG 24 hr tablet [Pharmacy Med Name: PAROXETINE ER 25 MG TABLET] 90 tablet 0    Sig: TAKE 1 TABLET (25 MG TOTAL) BY MOUTH DAILY.     Psychiatry:  Antidepressants - SSRI Passed - 05/05/2023  8:53 AM      Passed - Valid encounter within last 6 months    Recent Outpatient Visits           5 months ago Generalized anxiety disorder   [place] [facility] at [facility], Nyoka Cowden, MD   8 months ago Annual physical exam   [facility] at [facility], Nyoka Cowden, MD   1 year ago Annual physical exam   [facility] at [facility], Nyoka Cowden, MD   2 years ago Annual physical exam   [facility] at [facility], Nyoka Cowden, MD   3 years ago Annual physical exam   [facility] at [facility], Nyoka Cowden, MD       Future Appointments             In 1 week Janalee Dane [facility] [place]   In 3 months Judithann Graves, Nyoka Cowden, MD [facility] at [facility]

## 2023-05-12 ENCOUNTER — Other Ambulatory Visit: Payer: BC Managed Care – PPO

## 2023-05-12 DIAGNOSIS — N401 Enlarged prostate with lower urinary tract symptoms: Secondary | ICD-10-CM | POA: Diagnosis not present

## 2023-05-12 DIAGNOSIS — N138 Other obstructive and reflux uropathy: Secondary | ICD-10-CM | POA: Diagnosis not present

## 2023-05-13 LAB — BASIC METABOLIC PANEL
BUN/Creatinine Ratio: 10 (ref 10–24)
BUN: 12 mg/dL (ref 8–27)
CO2: 25 mmol/L (ref 20–29)
Calcium: 9.9 mg/dL (ref 8.6–10.2)
Chloride: 98 mmol/L (ref 96–106)
Creatinine, Ser: 1.15 mg/dL (ref 0.76–1.27)
Glucose: 98 mg/dL (ref 70–99)
Potassium: 4.6 mmol/L (ref 3.5–5.2)
Sodium: 139 mmol/L (ref 134–144)
eGFR: 72 mL/min/{1.73_m2} (ref >59)

## 2023-05-13 LAB — PSA: Prostate Specific Ag, Serum: 1.7 ng/mL (ref 0.0–4.0)

## 2023-05-16 ENCOUNTER — Ambulatory Visit (INDEPENDENT_AMBULATORY_CARE_PROVIDER_SITE_OTHER): Payer: BC Managed Care – PPO | Admitting: Physician Assistant

## 2023-05-16 ENCOUNTER — Encounter: Payer: Self-pay | Admitting: Physician Assistant

## 2023-05-16 VITALS — BP 98/66 | HR 84 | Ht 72.0 in | Wt 224.0 lb

## 2023-05-16 DIAGNOSIS — R3129 Other microscopic hematuria: Secondary | ICD-10-CM | POA: Diagnosis not present

## 2023-05-16 DIAGNOSIS — R6882 Decreased libido: Secondary | ICD-10-CM

## 2023-05-16 DIAGNOSIS — N401 Enlarged prostate with lower urinary tract symptoms: Secondary | ICD-10-CM

## 2023-05-16 DIAGNOSIS — N138 Other obstructive and reflux uropathy: Secondary | ICD-10-CM

## 2023-05-16 LAB — BLADDER SCAN AMB NON-IMAGING

## 2023-05-16 MED ORDER — SILODOSIN 8 MG PO CAPS: 8.0000 mg | ORAL_CAPSULE | Freq: Every day | ORAL | 3 refills | Status: AC

## 2023-05-16 MED ORDER — FINASTERIDE 5 MG PO TABS: 5.0000 mg | ORAL_TABLET | Freq: Every day | ORAL | 3 refills | Status: DC

## 2023-05-16 NOTE — Progress Notes (Signed)
05/16/2023 6:15 PM   Charles Turner June 09, 1960 [id number]  CC: Chief Complaint  Patient presents with   Follow-up   Benign Prostatic Hypertrophy   HPI: Charles Turner is a 62 y.o. male with PMH BPH with severe Bilo who has declined outlet procedures, chronic urinary retention, bladder diverticulum, persistent microscopic hematuria who has declined hematuria workup, and ED who presents today for annual follow-up.   Today he reports no significant changes over the past year.  He continues to decline outlet procedures or hematuria workup.  He understands that he has a serious problem that is not going to resolve on its own, but he does not wish to pursue further invasive testing or procedures at this time.  He does report low libido and has had low to borderline low testosterone testing in the past.  He is curious about TRT.  PSA this year is stable at 1.7.  Creatinine is stable at 1.15.  IPSS 18/mostly satisfied as below.  PVR 927 mL, previously >439mL.   IPSS     Row Name 05/16/23 1800         International Prostate Symptom Score   How often have you had the sensation of not emptying your bladder? About half the time     How often have you had to urinate less than every two hours? Less than 1 in 5 times     How often have you found you stopped and started again several times when you urinated? About half the time     How often have you found it difficult to postpone urination? Less than 1 in 5 times     How often have you had a weak urinary stream? More than half the time     How often have you had to strain to start urination? Almost always     How many times did you typically get up at night to urinate? 1 Time     Total IPSS Score 18       Quality of Life due to urinary symptoms   If you were to spend the rest of your life with your urinary condition just the way it is now how would you feel about that? Mostly Satisfied               PMH: Past Medical  History:  Diagnosis Date   Anxiety    High cholesterol    Prostate enlargement     Surgical History: Past Surgical History:  Procedure Laterality Date   COLONOSCOPY WITH PROPOFOL N/A 09/17/2021   Procedure: COLONOSCOPY WITH PROPOFOL;  Surgeon: Wyline Mood, MD;  Location[facility] END[facility]COPY;  Service: Gastroenterology;  Laterality: N/A;    Home Medications:  Allergies as of 05/16/2023       Reactions   Bee Venom Anaphylaxis   Wasp Venom Anaphylaxis        Medication List        Accurate as of May 16, 2023  6:15 PM. If you have any questions, ask your nurse or doctor.          ALPRAZolam 0.25 MG tablet Commonly known as: XANAX Take 1 tablet (0.25 mg total) by mouth daily as needed for anxiety.   atorvastatin 10 MG tablet Commonly known as: LIPITOR TAKE 1 TABLET BY MOUTH EVERY DAY   EPINEPHrine 0.3 mg/0.3 mL Soaj injection Commonly known as: EPI-PEN Inject 0.3 mg into the skin as needed.   finasteride 5 MG tablet Commonly known as: PROSCAR TAKE  1 TABLET (5 MG TOTAL) BY MOUTH DAILY.   MULTI VITAMIN PO Take by mouth daily.   PARoxetine 25 MG 24 hr tablet Commonly known as: PAXIL-CR TAKE 1 TABLET (25 MG TOTAL) BY MOUTH DAILY.   silodosin 8 MG Caps capsule Commonly known as: RAPAFLO TAKE 1 CAPSULE BY MOUTH DAILY WITH BREAKFAST.   tadalafil 20 MG tablet Commonly known as: CIALIS Take 1 tablet (20 mg total) by mouth daily as needed for erectile dysfunction.        Allergies:  Allergies  Allergen Reactions   Bee Venom Anaphylaxis   Wasp Venom Anaphylaxis    Family History: Family History  Adopted: Yes  Family history unknown: Yes    Social History:   reports that he has never smoked. He has never been exposed to tobacco smoke. He has quit using smokeless tobacco.  His smokeless tobacco use included chew. He reports that he does not drink alcohol and does not use drugs.  Physical Exam: BP 98/66   Pulse 84   Ht 6' (1.829 m)   Wt 224 lb  (101.6 kg)   BMI 30.38 kg/m   Constitutional:  Alert and oriented, no acute distress, nontoxic appearing HEENT: [place], AT Cardiovascular: No clubbing, cyanosis, or edema Respiratory: Normal respiratory effort, no increased work of breathing Skin: No rashes, bruises or suspicious lesions Neurologic: Grossly intact, no focal deficits, moving all 4 extremities Psychiatric: Normal mood and affect  Laboratory Data: Results for orders placed or performed in visit on 05/16/23  BLADDER SCAN AMB NON-IMAGING   Collection Time: 05/16/23  2:55 PM  Result Value Ref Range   Scan Result    Assessment & Plan:   1. Benign prostatic hyperplasia with urinary obstruction (Primary) We again had a lengthy conversation about his chronic, severe BOO.  We discussed that while his PSA and creatinine are stable, his bladder residual has increased significantly over the past year and is incredibly concerning for atonic bladder.  We discussed that increased duration of chronic bladder outlet obstruction increases the risk of irreversible bladder damage.  We again discussed that if he develops an atonic bladder, he is only management options will include CIC or chronic indwelling Foley catheter.  I offered him CIC teaching, but he is adamantly opposed to this.  He does not wish to pursue HOLEP or TURP.  He was curious about UroLift, but we discussed that his prostate is too large for this.  He was somewhat interested in PAE, so I gave him more information about the procedure, but he declined a consultation referral for now.  He states that if he did want a pursue this it probably would not be until the end of next year.  Will continue finasteride and silodosin. - BLADDER SCAN AMB NON-IMAGING - PSA; Future - Basic metabolic panel; Future - silodosin (RAPAFLO) 8 MG CAPS capsule; Take 1 capsule (8 mg total) by mouth daily with breakfast.  Dispense: 90 capsule; Refill: 3 - finasteride (PROSCAR) 5 MG tablet; Take 1  tablet (5 mg total) by mouth daily.  Dispense: 90 tablet; Refill: 3  2. Microscopic hematuria Unable to void today for a UA, will continue to monitor.  Notably, he has refused hematuria workups historically.  3. Low libido Likely side effect of finasteride.  He would like me to check his testosterone.  We discussed that TRT could worsen his bladder outlet obstruction so I am extremely hesitant to pursue this.  We discussed stopping finasteride would also likely significantly worsen  his bladder outlet obstruction, so I strongly advise against this. - Testosterone; Future   Return in about 2 weeks (around 05/30/2023) for Lab visit for AM testosterone + 1 year IPSS, PVR, PSA, BMP.  Carman Ching, PA-C  [facility] [place] [address], [address] [place], [place] [address] [phone]

## 2023-05-30 ENCOUNTER — Other Ambulatory Visit: Payer: BC Managed Care – PPO

## 2023-05-30 NOTE — Telephone Encounter (Signed)
Appt scheduled

## 2023-06-06 ENCOUNTER — Other Ambulatory Visit: Payer: BC Managed Care – PPO

## 2023-06-07 ENCOUNTER — Other Ambulatory Visit
Admission: RE | Admit: 2023-06-07 | Discharge: 2023-06-07 | Disposition: A | Discharge status: Home / Self Care | Payer: BC Managed Care – PPO | Attending: Physician Assistant | Admitting: Physician Assistant

## 2023-06-07 DIAGNOSIS — R6882 Decreased libido: Secondary | ICD-10-CM | POA: Diagnosis not present

## 2023-06-08 LAB — TESTOSTERONE: Testosterone: 335 ng/dL (ref 264–916)

## 2023-06-09 DIAGNOSIS — N529 Male erectile dysfunction, unspecified: Secondary | ICD-10-CM

## 2023-06-09 DIAGNOSIS — R6882 Decreased libido: Secondary | ICD-10-CM

## 2023-06-11 ENCOUNTER — Other Ambulatory Visit: Payer: Self-pay | Admitting: Internal Medicine

## 2023-06-11 ENCOUNTER — Other Ambulatory Visit: Payer: Self-pay | Admitting: Physician Assistant

## 2023-06-11 DIAGNOSIS — N529 Male erectile dysfunction, unspecified: Secondary | ICD-10-CM

## 2023-06-11 DIAGNOSIS — F411 Generalized anxiety disorder: Secondary | ICD-10-CM

## 2023-06-13 NOTE — Telephone Encounter (Signed)
 Please review.  KP

## 2023-06-13 NOTE — Telephone Encounter (Signed)
 Requested medication (s) are due for refill today: yes  Requested medication (s) are on the active medication list: yes  Last refill:  02/06/23 #30 2 RF  Future visit scheduled: yes  Notes to clinic:  med not delegated to NT to RF   Requested Prescriptions  Pending Prescriptions Disp Refills   ALPRAZolam  (XANAX ) 0.25 MG tablet [Pharmacy Med Name: ALPRAZOLAM  0.25 MG TABLET] 30 tablet 2    Sig: TAKE 1 TABLET BY MOUTH DAILY AS NEEDED FOR ANXIETY     Not Delegated - Psychiatry: Anxiolytics/Hypnotics 2 Failed - 06/13/2023 12:16 PM      Failed - This refill cannot be delegated      Failed - Urine Drug Screen completed in last 360 days      Failed - Valid encounter within last 6 months    Recent Outpatient Visits           6 months ago Generalized anxiety disorder   [place] [facility], Leita DEL, MD   10 months ago Annual physical exam   [facility] at [facility], Leita DEL, MD   1 year ago Annual physical exam   [facility] at [facility], Leita DEL, MD   2 years ago Annual physical exam   [facility] Medicine at [facility], Leita DEL, MD   3 years ago Annual physical exam   [facility] Medicine at [facility], Leita DEL, MD       Future Appointments             In 2 weeks Francisca, Redell BROCKS, MD [facility] [place]   In 2 months Adie Leita DEL, MD [facility] Sports Medicine at [facility]   In 11 months Maurine Lukes, PA-C [facility] - Patient is not pregnant

## 2023-06-23 ENCOUNTER — Other Ambulatory Visit
Admission: RE | Admit: 2023-06-23 | Discharge: 2023-06-23 | Disposition: A | Discharge status: Home / Self Care | Payer: BC Managed Care – PPO | Attending: Urology | Admitting: Urology

## 2023-06-23 DIAGNOSIS — N401 Enlarged prostate with lower urinary tract symptoms: Secondary | ICD-10-CM | POA: Insufficient documentation

## 2023-06-23 DIAGNOSIS — N529 Male erectile dysfunction, unspecified: Secondary | ICD-10-CM

## 2023-06-23 DIAGNOSIS — N138 Other obstructive and reflux uropathy: Secondary | ICD-10-CM | POA: Diagnosis not present

## 2023-06-23 DIAGNOSIS — R6882 Decreased libido: Secondary | ICD-10-CM | POA: Diagnosis not present

## 2023-06-23 LAB — BASIC METABOLIC PANEL
Anion gap: 5 (ref 5–15)
BUN: 15 mg/dL (ref 8–23)
CO2: 30 mmol/L (ref 22–32)
Calcium: 9.5 mg/dL (ref 8.9–10.3)
Chloride: 100 mmol/L (ref 98–111)
Creatinine, Ser: 1.11 mg/dL (ref 0.61–1.24)
GFR, Estimated: >60 mL/min (ref >60)
Glucose, Bld: 86 mg/dL (ref 70–99)
Potassium: 4.6 mmol/L (ref 3.5–5.1)
Sodium: 135 mmol/L (ref 135–145)

## 2023-06-24 LAB — TESTOSTERONE: Testosterone: 292 ng/dL (ref 264–916)

## 2023-06-24 LAB — PSA: Prostatic Specific Antigen: 1.57 ng/mL (ref 0.00–4.00)

## 2023-06-26 ENCOUNTER — Other Ambulatory Visit: Payer: Self-pay

## 2023-06-26 DIAGNOSIS — N401 Enlarged prostate with lower urinary tract symptoms: Secondary | ICD-10-CM

## 2023-06-27 ENCOUNTER — Encounter: Payer: Self-pay | Admitting: Urology

## 2023-06-27 ENCOUNTER — Ambulatory Visit (INDEPENDENT_AMBULATORY_CARE_PROVIDER_SITE_OTHER): Payer: BC Managed Care – PPO | Admitting: Urology

## 2023-06-27 VITALS — BP 123/81 | HR 65 | Ht 72.0 in | Wt 222.0 lb

## 2023-06-27 DIAGNOSIS — N529 Male erectile dysfunction, unspecified: Secondary | ICD-10-CM | POA: Diagnosis not present

## 2023-06-27 DIAGNOSIS — N401 Enlarged prostate with lower urinary tract symptoms: Secondary | ICD-10-CM

## 2023-06-27 DIAGNOSIS — N138 Other obstructive and reflux uropathy: Secondary | ICD-10-CM

## 2023-06-27 LAB — BLADDER SCAN AMB NON-IMAGING

## 2023-06-27 MED ORDER — CLOMIPHENE CITRATE 50 MG PO TABS: 25.0000 mg | ORAL_TABLET | Freq: Every day | ORAL | 6 refills | Status: DC

## 2023-06-27 MED ORDER — TADALAFIL 20 MG PO TABS: 20.0000 mg | ORAL_TABLET | Freq: Every day | ORAL | 6 refills | Status: DC | PRN

## 2023-06-27 NOTE — Patient Instructions (Signed)

## 2023-06-27 NOTE — Progress Notes (Signed)
06/27/2023 10:59 AM   Charles Turner September 05, 1960 [id number]  Reason for visit: Incomplete bladder emptying, ED, borderline testosterone  HPI: 63 year old male has been followed by multiple prior urology procedures including Dr. Edwyna Shell, Dr. Ronne Binning, Dr. Apolinar Junes, and most recently [facility], [facility] over the last few years.  He has chronic incomplete bladder emptying with PVRs greater than since at least 2015, as well as known bladder diverticulum.  He has had negative microscopic hematuria workups with Dr. Edwyna Shell previously.  Renal function has been normal, most recent imaging with renal ultrasound in December 2022 showed no hydronephrosis, and prostate measured 75 g at that time.  He is on maximal medical therapy with finasteride and silodosin.  He is not particularly bothered by his urinary symptoms, but he does strain to void.  He has been very resistant to considering intermittent catheterization or outlet procedures.  He has refused urodynamics and diascopy previously.  We had a very long conversation today about his chronic incomplete emptying that has been present for at least 10 years.  We discussed that some patients have chronic incomplete emptying and that if the bladder is low pressure and there is no hydronephrosis this can be monitored in some patients.  We discussed the concept of atonic bladder or worsening bladder function secondary to incomplete emptying.  He remains resistant to outlet procedures.  If he develops a retention, hydronephrosis, recurrent infections, likely would benefit from HOLEP. We discussed the risks and benefits of HoLEP at length.  The procedure requires general anesthesia and takes 1 to 2 hours, and a holmium laser is used to enucleate the prostate and push this tissue into the bladder.  A morcellator is then used to remove this tissue, which is sent for pathology.  The vast majority(>95%) of patients are able to discharge the same day with a catheter  in place for 2 to 3 days, and will follow-up in clinic for a voiding trial.  We specifically discussed the risks of bleeding, infection, retrograde ejaculation, temporary urgency and urge incontinence, very low risk of long-term incontinence, urethral stricture/bladder neck contracture, pathologic evaluation of prostate tissue and possible detection of prostate cancer or other malignancy, and possible need for additional procedures.  He also had questions about his borderline testosterone levels and low libido/ED.Testosterone levels have been borderline including 335 in December 2024, and 292 in January 2025.  We reviewed the AUA guidelines that require 2 levels <300 to consider exogenous testosterone replacement.  We also discussed the effect of testosterone on BPH and that this would likely exacerbate his urinary symptoms.  Using shared decision making he was interested in trial of Clomid and risks and benefits were discussed.  He also would like to continue Cialis on demand, and refills of the 20 mg dose were prescribed.  -Continue BPH medications-> he prefers monitoring of his chronically elevated PVR and we discussed risk including worsening renal function, atonic bladder, and return precautions discussed extensively including worsening symptoms, retention, UTIs, or hydronephrosis that would warrant consideration of an outlet procedure.  Will call with renal ultrasound results.  He understands need for more definitive treatment like outlet procedure if he develops hydronephrosis or worsening renal function.  If renal ultrasound is benign, can continue yearly follow-up for PVR, renal ultrasound, BMP -Trial of Clomid and Cialis for borderline testosterone levels and ED, RTC 6 weeks testosterone prior  I spent 45 total minutes on the day of the encounter including pre-visit review of the medical record, face-to-face time  with the patient, and post visit ordering of labs/imaging/tests.  Extensive review of  records from prior urologist, as well as prior imaging test.   Sondra Come, MD  [facility] [address], [address] [place], [place] [address] [phone]

## 2023-07-24 ENCOUNTER — Ambulatory Visit
Admission: RE | Admit: 2023-07-24 | Discharge: 2023-07-24 | Disposition: A | Discharge status: Home / Self Care | Payer: BC Managed Care – PPO | Source: Ambulatory Visit | Attending: Urology | Admitting: Urology

## 2023-07-24 DIAGNOSIS — N3289 Other specified disorders of bladder: Secondary | ICD-10-CM | POA: Diagnosis not present

## 2023-07-24 DIAGNOSIS — N138 Other obstructive and reflux uropathy: Secondary | ICD-10-CM | POA: Diagnosis not present

## 2023-07-24 DIAGNOSIS — N400 Enlarged prostate without lower urinary tract symptoms: Secondary | ICD-10-CM | POA: Diagnosis not present

## 2023-07-24 DIAGNOSIS — N401 Enlarged prostate with lower urinary tract symptoms: Secondary | ICD-10-CM | POA: Insufficient documentation

## 2023-07-24 MED ORDER — CLOMIPHENE CITRATE 50 MG PO TABS: 25.0000 mg | ORAL_TABLET | Freq: Every day | ORAL | 0 refills | Status: DC

## 2023-08-08 ENCOUNTER — Other Ambulatory Visit
Admission: RE | Admit: 2023-08-08 | Discharge: 2023-08-08 | Disposition: A | Discharge status: Home / Self Care | Attending: Urology | Admitting: Urology

## 2023-08-08 DIAGNOSIS — N529 Male erectile dysfunction, unspecified: Secondary | ICD-10-CM | POA: Insufficient documentation

## 2023-08-09 ENCOUNTER — Ambulatory Visit (INDEPENDENT_AMBULATORY_CARE_PROVIDER_SITE_OTHER): Payer: BC Managed Care – PPO | Admitting: Urology

## 2023-08-09 ENCOUNTER — Encounter: Payer: Self-pay | Admitting: Urology

## 2023-08-09 VITALS — BP 130/76 | HR 72

## 2023-08-09 DIAGNOSIS — N401 Enlarged prostate with lower urinary tract symptoms: Secondary | ICD-10-CM | POA: Diagnosis not present

## 2023-08-09 DIAGNOSIS — N509 Disorder of male genital organs, unspecified: Secondary | ICD-10-CM | POA: Diagnosis not present

## 2023-08-09 DIAGNOSIS — N138 Other obstructive and reflux uropathy: Secondary | ICD-10-CM

## 2023-08-09 LAB — TESTOSTERONE: Testosterone: 709 ng/dL (ref 264–916)

## 2023-08-09 NOTE — Progress Notes (Signed)
   08/09/2023 9:48 AM   Charles Turner [birth year] [id number]  Reason for visit: Follow up low testosterone, incomplete bladder emptying, testicular lesion  HPI: Complex 63 year old male who has been followed by multiple prior urology providers including Dr. Edwyna Shell, Dr. Ronne Binning, Dr. Apolinar Junes, and Carman Ching, PA over the last few years.  He has chronic incomplete bladder emptying's with PVRs greater than since at least 2015 as well as a known bladder diverticulum.  Renal function is normal, renal ultrasound February 2025 shows no hydronephrosis, and he has not had UTIs or required catheterization.  He has deferred CIC or outlet procedures and opted for surveillance of his elevated PVRs.  Remains on maximal medical therapy with finasteride and silodosin.  At our visit in January 2025 he also reported low libido/ED and had borderline testosterone levels of 335 and 292.  He opted for trial of Clomid.  He thinks he may feel a little bit better with some increased energy on that medication, has had an excellent increase in testosterone to 709 from 292 prior.  He would like to continue the Clomid.  Finally, he had a question about a new testicular lesion.  He noticed a lump on the left side a few months ago.  Is not painful.  On exam he appears to have a 2 cm spherical lesion on the superior aspect of the testicle, difficult to ascertain if this is an epididymal cyst or testicular lesion.  I recommended scrotal ultrasound for further evaluation and will call with those results.  Scrotal ultrasound for further evaluation of left testicular lesion, call with results Continue Clomid, finasteride, silodosin RTC yearly for BMP, renal ultrasound, testosterone   Sondra Come, MD  [facility] [address] [place], [place] [address] [phone]

## 2023-08-10 ENCOUNTER — Other Ambulatory Visit: Payer: Self-pay | Admitting: Internal Medicine

## 2023-08-10 ENCOUNTER — Telehealth: Payer: Self-pay | Admitting: Urology

## 2023-08-10 MED ORDER — EPINEPHRINE 0.3 MG/0.3ML IJ SOAJ: 0.3000 mg | INTRAMUSCULAR | 0 refills | Status: DC | PRN

## 2023-08-10 NOTE — Telephone Encounter (Signed)
 LMOM for pt to call office to r/s appts w/Sam 12/25 (which were cancelled per [facility]) and need to r/s appt w/Sninksy 07/2024.

## 2023-08-14 ENCOUNTER — Other Ambulatory Visit: Payer: Self-pay | Admitting: Internal Medicine

## 2023-08-14 DIAGNOSIS — F411 Generalized anxiety disorder: Secondary | ICD-10-CM

## 2023-08-14 NOTE — Telephone Encounter (Signed)
 Please review.  KP

## 2023-08-14 NOTE — Telephone Encounter (Signed)
 Requested medication (s) are due for refill today - yes  Requested medication (s) are on the active medication list -yes  Future visit scheduled -yes  Last refill: 06/13/23 #30 1RF  Notes to clinic: non delegated Rx  Requested Prescriptions  Pending Prescriptions Disp Refills   ALPRAZolam (XANAX) 0.25 MG tablet [Pharmacy Med Name: ALPRAZOLAM 0.25 MG TABLET] 30 tablet 1    Sig: TAKE 1 TABLET BY MOUTH EVERY DAY AS NEEDED FOR ANXIETY     Not Delegated - Psychiatry: Anxiolytics/Hypnotics 2 Failed - 08/14/2023 10:06 AM      Failed - This refill cannot be delegated      Failed - Urine Drug Screen completed in last 360 days      Failed - Valid encounter within last 6 months    Recent Outpatient Visits           8 months ago Generalized anxiety disorder   [place] [facility], Nyoka Cowden, MD   12 months ago Annual physical exam   [facility] at [facility], Nyoka Cowden, MD   2 years ago Annual physical exam   [facility] at [facility], Nyoka Cowden, MD   3 years ago Annual physical exam   [facility] at [facility], Nyoka Cowden, MD   4 years ago Annual physical exam   [facility] at [facility], Nyoka Cowden, MD       Future Appointments             In 4 days Judithann Graves Nyoka Cowden, MD [facility] Primary Care & Sports Medicine at [facility], [facility]   In 1 year Richardo Hanks, Laurette Schimke, MD [facility] [place]            Passed - Patient is not pregnant         Requested Prescriptions  Pending Prescriptions Disp Refills   ALPRAZolam (XANAX) 0.25 MG tablet [Pharmacy Med Name: ALPRAZOLAM 0.25 MG TABLET] 30 tablet 1    Sig: TAKE 1 TABLET BY MOUTH EVERY DAY AS NEEDED FOR ANXIETY     Not Delegated - Psychiatry: Anxiolytics/Hypnotics 2 Failed - 08/14/2023 10:06 AM       Failed - This refill cannot be delegated      Failed - Urine Drug Screen completed in last 360 days      Failed - Valid encounter within last 6 months    Recent Outpatient Visits           8 months ago Generalized anxiety disorder   [place] [facility] at [facility], Nyoka Cowden, MD   12 months ago Annual physical exam   [facility] Medicine at [facility], Nyoka Cowden, MD   2 years ago Annual physical exam   [facility] & [facility] Medicine at [facility], Nyoka Cowden, MD   3 years ago Annual physical exam   [facility] Medicine at [facility], Nyoka Cowden, MD   4 years ago Annual physical exam   [facility] Medicine at [facility], Nyoka Cowden, MD       Future Appointments             In 4 days Reubin Milan, MD  [facility] Primary [facility] Medicine at [facility] [place]   In 1 year Dunn Loring, Laurette Schimke, MD [facility] - Patient is not pregnant

## 2023-08-18 ENCOUNTER — Ambulatory Visit (INDEPENDENT_AMBULATORY_CARE_PROVIDER_SITE_OTHER): Payer: BC Managed Care – PPO | Admitting: Internal Medicine

## 2023-08-18 ENCOUNTER — Encounter: Payer: Self-pay | Admitting: Internal Medicine

## 2023-08-18 VITALS — BP 122/78 | HR 71 | Ht 72.0 in | Wt 226.4 lb

## 2023-08-18 DIAGNOSIS — R7303 Prediabetes: Secondary | ICD-10-CM | POA: Insufficient documentation

## 2023-08-18 DIAGNOSIS — Z0000 Encounter for general adult medical examination without abnormal findings: Secondary | ICD-10-CM

## 2023-08-18 DIAGNOSIS — N401 Enlarged prostate with lower urinary tract symptoms: Secondary | ICD-10-CM

## 2023-08-18 DIAGNOSIS — E782 Mixed hyperlipidemia: Secondary | ICD-10-CM

## 2023-08-18 DIAGNOSIS — R338 Other retention of urine: Secondary | ICD-10-CM | POA: Diagnosis not present

## 2023-08-18 DIAGNOSIS — F411 Generalized anxiety disorder: Secondary | ICD-10-CM

## 2023-08-18 MED ORDER — ATORVASTATIN CALCIUM 10 MG PO TABS
10.0000 mg | ORAL_TABLET | Freq: Every day | ORAL | 3 refills | Status: AC
Start: 2023-08-18 — End: ?

## 2023-08-18 MED ORDER — PAROXETINE HCL ER 25 MG PO TB24: 25.0000 mg | ORAL_TABLET | Freq: Every day | ORAL | 3 refills | Status: DC

## 2023-08-18 NOTE — Progress Notes (Signed)
 Date:  08/18/2023   Name:  Charles Turner   DOB:  1960/10/26   MRN:  [id number]   Chief Complaint: Annual Exam [name] is a 63 y.o. male who presents today for his Complete Annual Exam. He feels well. He reports exercising 3 days a week goes to the gym, machines, treadmill. He reports he is sleeping well.   Health Maintenance  Topic Date Due   HIV Screening  Never done   Flu Shot  08/28/2023*   COVID-19 Vaccine (3 - Pfizer risk series) 09/03/2023*   Zoster (Shingles) Vaccine (1 of 2) 11/18/2023*   DTaP/Tdap/Td vaccine (1 - Tdap) 08/17/2024*   Colon Cancer Screening  09/17/2024   Hepatitis C Screening  Completed   HPV Vaccine  Aged Out   Cologuard (Stool DNA test)  Discontinued  *Topic was postponed. The date shown is not the original due date.    Lab Results  Component Value Date   PSA1 1.7 05/12/2023   PSA1 2.0 05/13/2022   PSA1 1.9 05/11/2021    Hyperlipidemia This is a chronic problem. The problem is controlled. Pertinent negatives include no chest pain, myalgias or shortness of breath. Current antihyperlipidemic treatment includes statins. The current treatment provides significant improvement of lipids.  Anxiety Presents for follow-up visit. Patient reports no chest pain, dizziness, nervous/anxious behavior, palpitations or shortness of breath. Symptoms occur most days. The quality of sleep is fair.      Review of Systems  Constitutional:  Negative for appetite change, chills, diaphoresis, fatigue and unexpected weight change.  HENT:  Negative for hearing loss, tinnitus, trouble swallowing and voice change.   Eyes:  Negative for visual disturbance.  Respiratory:  Negative for choking, shortness of breath and wheezing.   Cardiovascular:  Negative for chest pain, palpitations and leg swelling.  Gastrointestinal:  Negative for abdominal pain, blood in stool, constipation and diarrhea.  Genitourinary:  Positive for scrotal swelling (small mass - [place] ordered  by urology). Negative for difficulty urinating, dysuria, frequency, hematuria and urgency.  Musculoskeletal:  Negative for arthralgias, back pain and myalgias.  Skin:  Negative for color change and rash.  Neurological:  Negative for dizziness, syncope and headaches.  Hematological:  Negative for adenopathy.  Psychiatric/Behavioral:  Negative for dysphoric mood and sleep disturbance. The patient is not nervous/anxious.      Lab Results  Component Value Date   NA 135 06/23/2023   K 4.6 06/23/2023   CO2 30 06/23/2023   GLUCOSE 86 06/23/2023   BUN 15 06/23/2023   CREATININE 1.11 06/23/2023   CALCIUM 9.5 06/23/2023   EGFR 72 05/12/2023   GFRNONAA >60 06/23/2023   Lab Results  Component Value Date   CHOL 171 08/16/2022   HDL 61 08/16/2022   LDLCALC 94 08/16/2022   TRIG 89 08/16/2022   CHOLHDL 2.8 08/16/2022   Lab Results  Component Value Date   TSH 2.390 08/16/2022   Lab Results  Component Value Date   HGBA1C 6.0 (H) 08/16/2022   Lab Results  Component Value Date   WBC 5.9 08/16/2022   HGB 14.1 08/16/2022   HCT 43.0 08/16/2022   MCV 89 08/16/2022   PLT 293 08/16/2022   Lab Results  Component Value Date   ALT 26 08/16/2022   AST 29 08/16/2022   ALKPHOS 57 08/16/2022   BILITOT 0.5 08/16/2022   No results found for: "25OHVITD2", "25OHVITD3", "VD25OH"   Patient Active Problem List   Diagnosis Date Noted   Prediabetes 08/18/2023  Squamous cell skin cancer, wrist, right 08/06/2021   Primary osteoarthritis of both knees 03/15/2016   Urinary retention due to benign prostatic hyperplasia 12/22/2015   Generalized anxiety disorder 06/17/2015   Bee sting allergy 06/17/2015   Mixed hyperlipidemia 05/05/2015   Bladder neoplasm 05/05/2015   Failure of erection 05/05/2015    Allergies  Allergen Reactions   Bee Venom Anaphylaxis   Wasp Venom Anaphylaxis    Past Surgical History:  Procedure Laterality Date   COLONOSCOPY WITH PROPOFOL N/A 09/17/2021   Procedure:  COLONOSCOPY WITH PROPOFOL;  Surgeon: Wyline Mood, MD;  Location: [facility] ENDOSCOPY;  Service: Gastroenterology;  Laterality: N/A;    Social History   Tobacco Use   Smoking status: Never    Passive exposure: Never   Smokeless tobacco: Former    Types: Engineer, drilling   Vaping status: Never Used  Substance Use Topics   Alcohol use: No    Alcohol/week: 0.0 standard drinks of alcohol   Drug use: No     Medication list has been reviewed and updated.  Current Meds  Medication Sig   ALPRAZolam (XANAX) 0.25 MG tablet TAKE 1 TABLET BY MOUTH EVERY DAY AS NEEDED FOR ANXIETY   clomiPHENE (CLOMID) 50 MG tablet Take 0.5 tablets (25 mg total) by mouth daily.   EPINEPHrine 0.3 mg/0.3 mL IJ SOAJ injection Inject 0.3 mg into the skin as needed.   finasteride (PROSCAR) 5 MG tablet Take 1 tablet (5 mg total) by mouth daily.   Multiple Vitamin (MULTI VITAMIN PO) Take by mouth daily.   silodosin (RAPAFLO) 8 MG CAPS capsule Take 1 capsule (8 mg total) by mouth daily with breakfast.   [DISCONTINUED] atorvastatin (LIPITOR) 10 MG tablet TAKE 1 TABLET BY MOUTH EVERY DAY   [DISCONTINUED] PARoxetine (PAXIL-CR) 25 MG 24 hr tablet TAKE 1 TABLET (25 MG TOTAL) BY MOUTH DAILY.       08/18/2023    8:49 AM 12/05/2022   10:23 AM 08/16/2022   10:18 AM 08/06/2021    8:48 AM  GAD 7 : Generalized Anxiety Score  Nervous, Anxious, on Edge 1 3 0 0  Control/stop worrying 0 1 0 0  Worry too much - different things 0 1 0 0  Trouble relaxing 0 1 0 0  Restless 1 2 0 0  Easily annoyed or irritable 1 2 0 0  Afraid - awful might happen 0 2 0 0  Total GAD 7 Score 3 12 0 0  Anxiety Difficulty Not difficult at all Somewhat difficult Not difficult at all Not difficult at all       08/18/2023    8:49 AM 12/05/2022   10:23 AM 08/16/2022   10:18 AM  Depression screen PHQ 2/9  Decreased Interest 0 0 0  Down, Depressed, Hopeless 0 2 0  PHQ - 2 Score 0 2 0  Altered sleeping 0 0 0  Tired, decreased energy 0 0 0  Change in  appetite 0 0 0  Feeling bad or failure about yourself  0 0 0  Trouble concentrating 0 0 0  Moving slowly or fidgety/restless 0 3 0  Suicidal thoughts 0 0 0  PHQ-9 Score 0 5 0  Difficult doing work/chores Not difficult at all Somewhat difficult Not difficult at all    BP Readings from Last 3 Encounters:  08/18/23 122/78  08/09/23 130/76  06/27/23 123/81    Physical Exam Vitals and nursing note reviewed.  Constitutional:      Appearance: Normal appearance. He is well-developed.  HENT:     Head: Normocephalic.     Right Ear: Tympanic membrane, ear canal and external ear normal.     Left Ear: Tympanic membrane, ear canal and external ear normal.     Nose: Nose normal.  Eyes:     Conjunctiva/sclera: Conjunctivae normal.     Pupils: Pupils are equal, round, and reactive to light.  Neck:     Thyroid: No thyromegaly.     Vascular: No carotid bruit.  Cardiovascular:     Rate and Rhythm: Normal rate and regular rhythm.     Pulses: Normal pulses.     Heart sounds: Normal heart sounds.  Pulmonary:     Effort: Pulmonary effort is normal.     Breath sounds: Normal breath sounds. No wheezing.  Chest:  Breasts:    Right: No mass.     Left: No mass.  Abdominal:     General: Bowel sounds are normal.     Palpations: Abdomen is soft.     Tenderness: There is no abdominal tenderness.  Musculoskeletal:        General: Normal range of motion.     Cervical back: Normal range of motion and neck supple.     Right lower leg: No edema.     Left lower leg: No edema.  Lymphadenopathy:     Cervical: No cervical adenopathy.  Skin:    General: Skin is warm and dry.     Capillary Refill: Capillary refill takes less than 2 seconds.  Neurological:     General: No focal deficit present.     Mental Status: He is alert and oriented to person, place, and time.     Deep Tendon Reflexes: Reflexes are normal and symmetric.  Psychiatric:        Attention and Perception: Attention normal.        Mood  and Affect: Mood normal.        Thought Content: Thought content normal.     Wt Readings from Last 3 Encounters:  08/18/23 226 lb 6 oz (102.7 kg)  06/27/23 222 lb (100.7 kg)  05/16/23 224 lb (101.6 kg)    BP 122/78   Pulse 71   Ht 6' (1.829 m)   Wt 226 lb 6 oz (102.7 kg)   SpO2 96%   BMI 30.70 kg/m   Assessment and Plan:  Problem List Items Addressed This Visit       Unprioritized   Mixed hyperlipidemia   LDL is  Lab Results  Component Value Date   LDLCALC 94 08/16/2022   Current regimen is atorvastatin.  No medication side effects noted. Goal LDL is <100.       Relevant Medications   atorvastatin (LIPITOR) 10 MG tablet   Other Relevant Orders   Lipid panel   Generalized anxiety disorder   Clinically stable on Paxil and PRN Xanax.  No SI or HI on evaluation. Plan to continue same medications for now.       Relevant Medications   PARoxetine (PAXIL-CR) 25 MG 24 hr tablet   Other Relevant Orders   TSH   Urinary retention due to benign prostatic hyperplasia   Followed by Urology - last PSA 1.7 04/2023 On Proscar and Rapaflo and symptoms are stable. He wants to postpone any surgical intervention as long as possible.      Relevant Orders   CBC with Differential/Platelet   Prediabetes   Managed with diet only. Lab Results  Component Value Date   HGBA1C 6.0 (  H) 08/16/2022         Relevant Orders   Comprehensive metabolic panel   Hemoglobin A1c   Other Visit Diagnoses       Annual physical exam    -  Primary   continue healthy diet and exercise. He declines routine vaccinations   Relevant Orders   CBC with Differential/Platelet   Comprehensive metabolic panel   Hemoglobin A1c   Lipid panel   TSH       Return in about 1 year (around 08/17/2024) for CPX with Dr. Elaina Pattee.    Reubin Milan, MD [facility] [place]

## 2023-08-18 NOTE — Assessment & Plan Note (Addendum)
 Followed by Urology - last PSA 1.7 04/2023 On Proscar and Rapaflo and symptoms are stable. He wants to postpone any surgical intervention as long as possible.

## 2023-08-18 NOTE — Assessment & Plan Note (Signed)
 Managed with diet only. Lab Results  Component Value Date   HGBA1C 6.0 (H) 08/16/2022

## 2023-08-18 NOTE — Assessment & Plan Note (Signed)
 LDL is  Lab Results  Component Value Date   LDLCALC 94 08/16/2022   Current regimen is atorvastatin.  No medication side effects noted. Goal LDL is <100.

## 2023-08-18 NOTE — Assessment & Plan Note (Signed)
 Clinically stable on Paxil and PRN Xanax.  No SI or HI on evaluation. Plan to continue same medications for now.

## 2023-08-19 LAB — CBC WITH DIFFERENTIAL/PLATELET
Basophils Absolute: 0 10*3/uL (ref 0.0–0.2)
Basos: 1 %
EOS (ABSOLUTE): 0.1 10*3/uL (ref 0.0–0.4)
Eos: 1 %
Hematocrit: 44.1 % (ref 37.5–51.0)
Hemoglobin: 14.6 g/dL (ref 13.0–17.7)
Immature Grans (Abs): 0.1 10*3/uL (ref 0.0–0.1)
Immature Granulocytes: 1 %
Lymphocytes Absolute: 2.8 10*3/uL (ref 0.7–3.1)
Lymphs: 34 %
MCH: 30.5 pg (ref 26.6–33.0)
MCHC: 33.1 g/dL (ref 31.5–35.7)
MCV: 92 fL (ref 79–97)
Monocytes Absolute: 0.7 10*3/uL (ref 0.1–0.9)
Monocytes: 8 %
Neutrophils Absolute: 4.5 10*3/uL (ref 1.4–7.0)
Neutrophils: 55 %
Platelets: 254 10*3/uL (ref 150–450)
RBC: 4.79 x10E6/uL (ref 4.14–5.80)
RDW: 12.9 % (ref 11.6–15.4)
WBC: 8.2 10*3/uL (ref 3.4–10.8)

## 2023-08-19 LAB — COMPREHENSIVE METABOLIC PANEL
ALT: 18 IU/L (ref 0–44)
AST: 18 IU/L (ref 0–40)
Albumin: 4.4 g/dL (ref 3.9–4.9)
Alkaline Phosphatase: 48 IU/L (ref 44–121)
BUN/Creatinine Ratio: 13 (ref 10–24)
BUN: 17 mg/dL (ref 8–27)
Bilirubin Total: 0.5 mg/dL (ref 0.0–1.2)
CO2: 26 mmol/L (ref 20–29)
Calcium: 9.8 mg/dL (ref 8.6–10.2)
Chloride: 103 mmol/L (ref 96–106)
Creatinine, Ser: 1.29 mg/dL — ABNORMAL HIGH (ref 0.76–1.27)
Globulin, Total: 2.2 g/dL (ref 1.5–4.5)
Glucose: 96 mg/dL (ref 70–99)
Potassium: 5.1 mmol/L (ref 3.5–5.2)
Sodium: 142 mmol/L (ref 134–144)
Total Protein: 6.6 g/dL (ref 6.0–8.5)
eGFR: 63 mL/min/{1.73_m2} (ref >59)

## 2023-08-19 LAB — LIPID PANEL
Chol/HDL Ratio: 3 ratio (ref 0.0–5.0)
Cholesterol, Total: 158 mg/dL (ref 100–199)
HDL: 52 mg/dL (ref >39)
LDL Chol Calc (NIH): 75 mg/dL (ref 0–99)
Triglycerides: 182 mg/dL — ABNORMAL HIGH (ref 0–149)
VLDL Cholesterol Cal: 31 mg/dL (ref 5–40)

## 2023-08-19 LAB — TSH: TSH: 3.2 u[IU]/mL (ref 0.450–4.500)

## 2023-08-19 LAB — HEMOGLOBIN A1C
Est. average glucose Bld gHb Est-mCnc: 140 mg/dL
Hgb A1c MFr Bld: 6.5 % — ABNORMAL HIGH (ref 4.8–5.6)

## 2023-08-21 ENCOUNTER — Encounter: Payer: Self-pay | Admitting: Internal Medicine

## 2023-08-21 NOTE — Telephone Encounter (Signed)
 PT response.  JM

## 2023-09-28 ENCOUNTER — Other Ambulatory Visit: Payer: Self-pay

## 2023-09-28 DIAGNOSIS — R6882 Decreased libido: Secondary | ICD-10-CM

## 2023-09-28 DIAGNOSIS — E291 Testicular hypofunction: Secondary | ICD-10-CM

## 2023-09-28 MED ORDER — CLOMIPHENE CITRATE 50 MG PO TABS: 25.0000 mg | ORAL_TABLET | Freq: Every day | ORAL | 3 refills | Status: AC

## 2023-10-20 ENCOUNTER — Other Ambulatory Visit: Payer: Self-pay | Admitting: Internal Medicine

## 2023-10-20 DIAGNOSIS — F411 Generalized anxiety disorder: Secondary | ICD-10-CM

## 2023-10-24 NOTE — Telephone Encounter (Signed)
 Requested medications are due for refill today.  yes  Requested medications are on the active medications list.  yes  Last refill. 08/14/2023 #30 1 rf  Future visit scheduled.   With another provider  Notes to clinic.  Refill not delegated.    Requested Prescriptions  Pending Prescriptions Disp Refills   ALPRAZolam  (XANAX ) 0.25 MG tablet [Pharmacy Med Name: ALPRAZOLAM  0.25 MG TABLET] 30 tablet 1    Sig: TAKE 1 TABLET BY MOUTH EVERY DAY AS NEEDED FOR ANXIETY     Not Delegated - Psychiatry: Anxiolytics/Hypnotics 2 Failed - 10/24/2023  1:43 PM      Failed - This refill cannot be delegated      Failed - Urine Drug Screen completed in last 360 days      Passed - Patient is not pregnant      Passed - Valid encounter within last 6 months    Recent Outpatient Visits           2 months ago Annual physical exam   [facility] Medicine at [facility], Chales [place], MD       Future Appointments             In 9 months Estanislao Heimlich, Dennard Fisher, MD [facility] [place]   In 10 months Barnetta Liberty, MD [facility] Primary Care & Sports Medicine at [facility], [facility]

## 2023-10-24 NOTE — Telephone Encounter (Signed)
 Please review.  KP

## 2023-12-05 ENCOUNTER — Encounter: Payer: Self-pay | Admitting: Internal Medicine

## 2023-12-05 NOTE — Telephone Encounter (Signed)
 Please review and advise.   JM

## 2023-12-06 ENCOUNTER — Other Ambulatory Visit: Payer: Self-pay

## 2023-12-06 ENCOUNTER — Ambulatory Visit (INDEPENDENT_AMBULATORY_CARE_PROVIDER_SITE_OTHER): Admitting: Internal Medicine

## 2023-12-06 VITALS — BP 125/86 | HR 73 | Ht 72.0 in | Wt 223.0 lb

## 2023-12-06 DIAGNOSIS — F411 Generalized anxiety disorder: Secondary | ICD-10-CM

## 2023-12-06 DIAGNOSIS — Z91030 Bee allergy status: Secondary | ICD-10-CM | POA: Diagnosis not present

## 2023-12-06 MED ORDER — PAROXETINE HCL ER 37.5 MG PO TB24: 37.5000 mg | ORAL_TABLET | Freq: Every day | ORAL | 1 refills | Status: DC

## 2023-12-06 MED ORDER — EPINEPHRINE 0.3 MG/0.3ML IJ SOAJ: 0.3000 mg | INTRAMUSCULAR | 2 refills | Status: DC | PRN

## 2023-12-06 NOTE — Assessment & Plan Note (Signed)
 Symptoms of anxiety are slowly worsening without any single acute triggering event. Will increase Paxil  to 37.5 mg daily. Stop taking Xanax  in the AM and use only PRN Follow up in 3-4 weeks if not improving.

## 2023-12-06 NOTE — Assessment & Plan Note (Signed)
 Severe life threatening allergy noted - had testing at [place] in 2017 Keeps Epipen  on hand at all times. No indication for repeat testing as treatment for a bee sting would not change

## 2023-12-06 NOTE — Progress Notes (Signed)
 Date:  12/06/2023   Name:  Charles Turner   DOB:  June 13, 1960   MRN:  [id number]   Chief Complaint: Anxiety  Anxiety Presents for follow-up (generalized anxiety is slowly worsening; he takes Paxil  25 mg and Xanax  0.25 mg every AM.  often takes a second Paxil  later in the day.) visit. Symptoms include excessive worry and nervous/anxious behavior. Patient reports no chest pain, depressed mood, dizziness, palpitations, shortness of breath or suicidal ideas. Symptoms occur most days. The quality of sleep is good.    It is becoming more difficult to ride as a passenger in the car.  He is having sweating in the evening.  All exacerbated by family social and health issues.   Review of Systems  Constitutional:  Positive for diaphoresis. Negative for chills, fever and unexpected weight change.  HENT:  Negative for trouble swallowing.   Respiratory:  Negative for chest tightness, shortness of breath and wheezing.   Cardiovascular:  Negative for chest pain and palpitations.  Musculoskeletal:  Negative for arthralgias.  Neurological:  Positive for headaches (mild headache associated with anxiety symptoms). Negative for dizziness.  Psychiatric/Behavioral:  Negative for dysphoric mood and suicidal ideas. The patient is nervous/anxious.      Lab Results  Component Value Date   NA 142 08/18/2023   K 5.1 08/18/2023   CO2 26 08/18/2023   GLUCOSE 96 08/18/2023   BUN 17 08/18/2023   CREATININE 1.29 (H) 08/18/2023   CALCIUM  9.8 08/18/2023   EGFR 63 08/18/2023   GFRNONAA >60 06/23/2023   Lab Results  Component Value Date   CHOL 158 08/18/2023   HDL 52 08/18/2023   LDLCALC 75 08/18/2023   TRIG 182 (H) 08/18/2023   CHOLHDL 3.0 08/18/2023   Lab Results  Component Value Date   TSH 3.200 08/18/2023   Lab Results  Component Value Date   HGBA1C 6.5 (H) 08/18/2023   Lab Results  Component Value Date   WBC 8.2 08/18/2023   HGB 14.6 08/18/2023   HCT 44.1 08/18/2023   MCV 92 08/18/2023    PLT 254 08/18/2023   Lab Results  Component Value Date   ALT 18 08/18/2023   AST 18 08/18/2023   ALKPHOS 48 08/18/2023   BILITOT 0.5 08/18/2023   No results found for: MARIEN BOLLS, VD25OH   Patient Active Problem List   Diagnosis Date Noted   Prediabetes 08/18/2023   Squamous cell skin cancer, wrist, right 08/06/2021   Primary osteoarthritis of both knees 03/15/2016   Urinary retention due to benign prostatic hyperplasia 12/22/2015   Generalized anxiety disorder 06/17/2015   Bee sting allergy 06/17/2015   Mixed hyperlipidemia 05/05/2015   Bladder neoplasm 05/05/2015   Failure of erection 05/05/2015    Allergies  Allergen Reactions   Bee Venom Anaphylaxis   Wasp Venom Anaphylaxis    Past Surgical History:  Procedure Laterality Date   COLONOSCOPY WITH PROPOFOL  N/A 09/17/2021   Procedure: COLONOSCOPY WITH PROPOFOL ;  Surgeon: Therisa Bi, MD;  Location: [facility] ENDOSCOPY;  Service: Gastroenterology;  Laterality: N/A;    Social History   Tobacco Use   Smoking status: Never    Passive exposure: Never   Smokeless tobacco: Former    Types: Engineer, drilling   Vaping status: Never Used  Substance Use Topics   Alcohol use: No    Alcohol/week: 0.0 standard drinks of alcohol   Drug use: No     Medication list has been reviewed and updated.  Current Meds  Medication  Sig   ALPRAZolam  (XANAX ) 0.25 MG tablet TAKE 1 TABLET BY MOUTH EVERY DAY AS NEEDED FOR ANXIETY   atorvastatin  (LIPITOR) 10 MG tablet Take 1 tablet (10 mg total) by mouth daily.   clomiPHENE  (CLOMID ) 50 MG tablet Take 0.5 tablets (25 mg total) by mouth daily.   EPINEPHrine  0.3 mg/0.3 mL IJ SOAJ injection Inject 0.3 mg into the skin as needed.   finasteride  (PROSCAR ) 5 MG tablet Take 1 tablet (5 mg total) by mouth daily.   Multiple Vitamin (MULTI VITAMIN PO) Take by mouth daily.   PARoxetine  (PAXIL  CR) 37.5 MG 24 hr tablet Take 1 tablet (37.5 mg total) by mouth daily.   silodosin  (RAPAFLO ) 8 MG  CAPS capsule Take 1 capsule (8 mg total) by mouth daily with breakfast.   [DISCONTINUED] PARoxetine  (PAXIL -CR) 25 MG 24 hr tablet Take 1 tablet (25 mg total) by mouth daily.       12/06/2023   11:03 AM 08/18/2023    8:49 AM 12/05/2022   10:23 AM 08/16/2022   10:18 AM  GAD 7 : Generalized Anxiety Score  Nervous, Anxious, on Edge 2 1 3  0  Control/stop worrying 2 0 1 0  Worry too much - different things 2 0 1 0  Trouble relaxing 2 0 1 0  Restless 2 1 2  0  Easily annoyed or irritable 0 1 2 0  Afraid - awful might happen 2 0 2 0  Total GAD 7 Score 12 3 12  0  Anxiety Difficulty Very difficult Not difficult at all Somewhat difficult Not difficult at all       12/06/2023   11:03 AM 08/18/2023    8:49 AM 12/05/2022   10:23 AM  Depression screen PHQ 2/9  Decreased Interest 2 0 0  Down, Depressed, Hopeless 2 0 2  PHQ - 2 Score 4 0 2  Altered sleeping 1 0 0  Tired, decreased energy 0 0 0  Change in appetite 0 0 0  Feeling bad or failure about yourself  0 0 0  Trouble concentrating 0 0 0  Moving slowly or fidgety/restless 0 0 3  Suicidal thoughts 0 0 0  PHQ-9 Score 5 0 5  Difficult doing work/chores Somewhat difficult Not difficult at all Somewhat difficult    BP Readings from Last 3 Encounters:  12/06/23 125/86  08/18/23 122/78  08/09/23 130/76    Physical Exam Vitals and nursing note reviewed.  Constitutional:      General: He is not in acute distress.    Appearance: He is well-developed.  HENT:     Head: Normocephalic and atraumatic.  Cardiovascular:     Rate and Rhythm: Normal rate and regular rhythm.     Heart sounds: No murmur heard. Pulmonary:     Effort: Pulmonary effort is normal. No respiratory distress.     Breath sounds: No wheezing or rhonchi.  Musculoskeletal:     Cervical back: Normal range of motion.  Lymphadenopathy:     Cervical: No cervical adenopathy.  Skin:    General: Skin is warm and dry.     Findings: No rash.  Neurological:     General: No focal  deficit present.     Mental Status: He is alert and oriented to person, place, and time.  Psychiatric:        Mood and Affect: Mood normal.        Behavior: Behavior normal.     Wt Readings from Last 3 Encounters:  12/06/23 223 lb (101.2 kg)  08/18/23 226 lb 6 oz (102.7 kg)  06/27/23 222 lb (100.7 kg)    BP 125/86   Pulse 73   Ht 6' (1.829 m)   Wt 223 lb (101.2 kg)   SpO2 97%   BMI 30.24 kg/m   Assessment and Plan:  Problem List Items Addressed This Visit       Unprioritized   Generalized anxiety disorder - Primary   Symptoms of anxiety are slowly worsening without any single acute triggering event. Will increase Paxil  to 37.5 mg daily. Stop taking Xanax  in the AM and use only PRN Follow up in 3-4 weeks if not improving.      Relevant Medications   PARoxetine  (PAXIL  CR) 37.5 MG 24 hr tablet   Bee sting allergy   Severe life threatening allergy noted - had testing at [facility] in 2017 Keeps Epipen  on hand at all times. No indication for repeat testing as treatment for a bee sting would not change        No follow-ups on file.    Leita HILARIO Adie, MD [facility] Primary [facility] Medicine [place]

## 2023-12-23 DIAGNOSIS — J069 Acute upper respiratory infection, unspecified: Secondary | ICD-10-CM | POA: Diagnosis not present

## 2023-12-23 DIAGNOSIS — Z20822 Contact with and (suspected) exposure to covid-19: Secondary | ICD-10-CM | POA: Diagnosis not present

## 2024-01-02 ENCOUNTER — Ambulatory Visit

## 2024-01-10 DIAGNOSIS — E782 Mixed hyperlipidemia: Secondary | ICD-10-CM | POA: Diagnosis not present

## 2024-01-10 DIAGNOSIS — N401 Enlarged prostate with lower urinary tract symptoms: Secondary | ICD-10-CM | POA: Diagnosis not present

## 2024-01-10 DIAGNOSIS — F411 Generalized anxiety disorder: Secondary | ICD-10-CM | POA: Diagnosis not present

## 2024-01-10 DIAGNOSIS — E119 Type 2 diabetes mellitus without complications: Secondary | ICD-10-CM | POA: Diagnosis not present

## 2024-01-14 ENCOUNTER — Other Ambulatory Visit: Payer: Self-pay | Admitting: Medical Genetics

## 2024-01-25 ENCOUNTER — Encounter: Payer: Self-pay | Admitting: Urology

## 2024-02-08 ENCOUNTER — Other Ambulatory Visit: Payer: Self-pay | Admitting: Physician Assistant

## 2024-02-08 DIAGNOSIS — N138 Other obstructive and reflux uropathy: Secondary | ICD-10-CM

## 2024-03-26 ENCOUNTER — Other Ambulatory Visit: Payer: Self-pay | Admitting: Medical Genetics

## 2024-03-26 DIAGNOSIS — Z006 Encounter for examination for normal comparison and control in clinical research program: Secondary | ICD-10-CM

## 2024-04-15 NOTE — Telephone Encounter (Signed)
Left Message for patient to return my call.  

## 2024-04-15 NOTE — Telephone Encounter (Signed)
 Patient called back, said that he is going to speak with [place] about coverage. Is not sold on any procedure right now and would prefer to look in to options, coverage, etc. Declined appointment today, once he speaks with [place], if he feels differently he will call back and get scheduled.

## 2024-05-07 ENCOUNTER — Other Ambulatory Visit: Payer: Self-pay | Admitting: Internal Medicine

## 2024-05-09 NOTE — Telephone Encounter (Signed)
 Requested Prescriptions  Pending Prescriptions Disp Refills   EPINEPHRINE  0.3 mg/0.3 mL IJ SOAJ injection [Pharmacy Med Name: EPINEPHRINE  0.3 MG AUTO-INJECT] 2 each 2    Sig: INJECT 0.3 MG INTO THE SKIN AS NEEDED.     Immunology: Antidotes Passed - 05/09/2024 12:04 PM      Passed - Valid encounter within last 12 months    Recent Outpatient Visits           5 months ago Generalized anxiety disorder   [place] [facility] Medicine at [facility], Leita DEL, MD   8 months ago Annual physical exam   [facility] Medicine at [facility], Leita DEL, MD       Future Appointments             In 3 months Lemon Raisin, MD [facility] & Sports Medicine at [facility], [address]   In 3 months Francisca, Redell BROCKS, MD [facility]rology [place]

## 2024-05-12 DIAGNOSIS — N529 Male erectile dysfunction, unspecified: Secondary | ICD-10-CM

## 2024-05-13 ENCOUNTER — Other Ambulatory Visit: Payer: Self-pay

## 2024-05-13 MED ORDER — TADALAFIL 20 MG PO TABS: 20.0000 mg | ORAL_TABLET | Freq: Every day | ORAL | 11 refills | Status: AC | PRN

## 2024-05-14 ENCOUNTER — Ambulatory Visit: Payer: Self-pay | Admitting: Physician Assistant

## 2024-05-23 ENCOUNTER — Other Ambulatory Visit: Payer: Self-pay | Admitting: Internal Medicine

## 2024-05-23 DIAGNOSIS — F411 Generalized anxiety disorder: Secondary | ICD-10-CM

## 2024-05-24 NOTE — Telephone Encounter (Signed)
 Requested Prescriptions  Pending Prescriptions Disp Refills   PARoxetine  (PAXIL -CR) 37.5 MG 24 hr tablet [Pharmacy Med Name: PAROXETINE  ER 37.5 MG TABLET] 90 tablet 0    Sig: TAKE 1 TABLET BY MOUTH EVERY DAY     Psychiatry:  Antidepressants - SSRI Passed - 05/24/2024  3:54 PM      Passed - Valid encounter within last 6 months    Recent Outpatient Visits           5 months ago Generalized anxiety disorder   [place] [facility] & [facility] Medicine at [facility], Leita DEL, MD   9 months ago Annual physical exam   [facility] Primary Care & [facility] Medicine at [facility], Leita DEL, MD       Future Appointments             In 2 months Lemon Raisin, MD [facility] Primary Care & Sports Medicine at [facility], [address]   In 2 months Francisca, Redell BROCKS, MD [facility] [place]

## 2024-06-23 ENCOUNTER — Other Ambulatory Visit: Payer: Self-pay | Admitting: Physician Assistant

## 2024-06-23 DIAGNOSIS — N529 Male erectile dysfunction, unspecified: Secondary | ICD-10-CM

## 2024-08-09 ENCOUNTER — Other Ambulatory Visit

## 2024-08-15 ENCOUNTER — Ambulatory Visit: Admitting: Urology

## 2024-08-19 ENCOUNTER — Encounter: Admitting: Student

## 2024-08-21 ENCOUNTER — Ambulatory Visit: Admitting: Urology
